# Patient Record
Sex: Female | Born: 1950
Health system: Southern US, Community
[De-identification: ages and names within clinical notes are randomized; demographics above are authoritative.]

## PROBLEM LIST (undated history)

## (undated) DIAGNOSIS — E7800 Pure hypercholesterolemia, unspecified: Secondary | ICD-10-CM

## (undated) DIAGNOSIS — Z923 Personal history of irradiation: Secondary | ICD-10-CM

## (undated) DIAGNOSIS — Z808 Family history of malignant neoplasm of other organs or systems: Secondary | ICD-10-CM

## (undated) HISTORY — DX: Family history of malignant neoplasm of other organs or systems: Z80.8

## (undated) HISTORY — PX: BREAST BIOPSY: SHX20

## (undated) HISTORY — PX: BUNIONECTOMY: SHX129

## (undated) HISTORY — PX: BREAST LUMPECTOMY: SHX2

## (undated) HISTORY — PX: OTHER SURGICAL HISTORY: SHX169

## (undated) HISTORY — PX: MOHS SURGERY: SHX181

## (undated) HISTORY — PX: TUBAL LIGATION: SHX77

## (undated) HISTORY — PX: BREAST EXCISIONAL BIOPSY: SUR124

## (undated) HISTORY — PX: REDUCTION MAMMAPLASTY: SUR839

## (undated) HISTORY — PX: TONSILLECTOMY AND ADENOIDECTOMY: SUR1326

---

## 2005-01-19 ENCOUNTER — Emergency Department (HOSPITAL_COMMUNITY): Admission: EM | Admit: 2005-01-19 | Discharge: 2005-01-19 | Payer: Self-pay | Admitting: Emergency Medicine

## 2005-02-20 ENCOUNTER — Other Ambulatory Visit: Admission: RE | Admit: 2005-02-20 | Discharge: 2005-02-20 | Payer: Self-pay | Admitting: Family Medicine

## 2005-03-20 ENCOUNTER — Encounter: Admission: RE | Admit: 2005-03-20 | Discharge: 2005-03-20 | Payer: Self-pay | Admitting: Family Medicine

## 2005-03-22 ENCOUNTER — Ambulatory Visit: Payer: Self-pay | Admitting: Gastroenterology

## 2005-04-04 ENCOUNTER — Ambulatory Visit: Payer: Self-pay | Admitting: Gastroenterology

## 2006-03-22 ENCOUNTER — Encounter: Admission: RE | Admit: 2006-03-22 | Discharge: 2006-03-22 | Payer: Self-pay | Admitting: Family Medicine

## 2007-03-25 ENCOUNTER — Encounter: Admission: RE | Admit: 2007-03-25 | Discharge: 2007-03-25 | Payer: Self-pay | Admitting: Family Medicine

## 2008-03-25 ENCOUNTER — Encounter: Admission: RE | Admit: 2008-03-25 | Discharge: 2008-03-25 | Payer: Self-pay | Admitting: Family Medicine

## 2009-04-08 ENCOUNTER — Encounter: Admission: RE | Admit: 2009-04-08 | Discharge: 2009-04-08 | Payer: Self-pay | Admitting: Obstetrics

## 2009-12-23 ENCOUNTER — Encounter: Admission: RE | Admit: 2009-12-23 | Discharge: 2009-12-23 | Payer: Self-pay | Admitting: Obstetrics

## 2010-04-10 ENCOUNTER — Encounter: Admission: RE | Admit: 2010-04-10 | Discharge: 2010-04-10 | Payer: Self-pay | Admitting: Obstetrics

## 2011-03-12 ENCOUNTER — Other Ambulatory Visit: Payer: Self-pay | Admitting: Obstetrics

## 2011-03-12 DIAGNOSIS — Z1231 Encounter for screening mammogram for malignant neoplasm of breast: Secondary | ICD-10-CM

## 2011-04-12 ENCOUNTER — Ambulatory Visit
Admission: RE | Admit: 2011-04-12 | Discharge: 2011-04-12 | Disposition: A | Discharge status: Home / Self Care | Payer: BC Managed Care – PPO | Source: Ambulatory Visit | Attending: Obstetrics | Admitting: Obstetrics

## 2011-04-12 DIAGNOSIS — Z1231 Encounter for screening mammogram for malignant neoplasm of breast: Secondary | ICD-10-CM

## 2012-04-24 ENCOUNTER — Other Ambulatory Visit: Payer: Self-pay | Admitting: Obstetrics

## 2012-04-24 DIAGNOSIS — Z1231 Encounter for screening mammogram for malignant neoplasm of breast: Secondary | ICD-10-CM

## 2012-05-29 ENCOUNTER — Ambulatory Visit
Admission: RE | Admit: 2012-05-29 | Discharge: 2012-05-29 | Disposition: A | Discharge status: Home / Self Care | Payer: BC Managed Care – PPO | Source: Ambulatory Visit | Attending: Obstetrics | Admitting: Obstetrics

## 2012-05-29 DIAGNOSIS — Z1231 Encounter for screening mammogram for malignant neoplasm of breast: Secondary | ICD-10-CM

## 2013-04-27 ENCOUNTER — Other Ambulatory Visit: Payer: Self-pay

## 2013-04-27 DIAGNOSIS — Z1231 Encounter for screening mammogram for malignant neoplasm of breast: Secondary | ICD-10-CM

## 2013-06-01 ENCOUNTER — Ambulatory Visit
Admission: RE | Admit: 2013-06-01 | Discharge: 2013-06-01 | Disposition: A | Discharge status: Home / Self Care | Payer: BC Managed Care – PPO | Source: Ambulatory Visit

## 2013-06-01 DIAGNOSIS — Z1231 Encounter for screening mammogram for malignant neoplasm of breast: Secondary | ICD-10-CM

## 2014-04-26 ENCOUNTER — Other Ambulatory Visit: Payer: Self-pay

## 2014-04-26 DIAGNOSIS — Z1231 Encounter for screening mammogram for malignant neoplasm of breast: Secondary | ICD-10-CM

## 2014-06-02 ENCOUNTER — Ambulatory Visit
Admission: RE | Admit: 2014-06-02 | Discharge: 2014-06-02 | Disposition: A | Discharge status: Home / Self Care | Payer: BLUE CROSS/BLUE SHIELD | Source: Ambulatory Visit

## 2014-06-02 DIAGNOSIS — Z1231 Encounter for screening mammogram for malignant neoplasm of breast: Secondary | ICD-10-CM

## 2014-07-21 ENCOUNTER — Other Ambulatory Visit: Payer: Self-pay | Admitting: Family Medicine

## 2014-07-21 ENCOUNTER — Encounter: Payer: Self-pay | Admitting: Gastroenterology

## 2014-07-21 ENCOUNTER — Other Ambulatory Visit (HOSPITAL_COMMUNITY)
Admission: RE | Admit: 2014-07-21 | Discharge: 2014-07-21 | Disposition: A | Discharge status: Home / Self Care | Payer: BLUE CROSS/BLUE SHIELD | Source: Ambulatory Visit | Attending: Family Medicine | Admitting: Family Medicine

## 2014-07-21 DIAGNOSIS — R87810 Cervical high risk human papillomavirus (HPV) DNA test positive: Secondary | ICD-10-CM | POA: Diagnosis present

## 2014-07-21 DIAGNOSIS — Z01419 Encounter for gynecological examination (general) (routine) without abnormal findings: Secondary | ICD-10-CM | POA: Insufficient documentation

## 2014-07-21 DIAGNOSIS — Z1151 Encounter for screening for human papillomavirus (HPV): Secondary | ICD-10-CM | POA: Diagnosis present

## 2014-07-22 LAB — CYTOLOGY - PAP

## 2015-02-24 ENCOUNTER — Encounter: Payer: Self-pay | Admitting: Gastroenterology

## 2015-04-04 ENCOUNTER — Ambulatory Visit (AMBULATORY_SURGERY_CENTER): Payer: Self-pay | Admitting: *Deleted

## 2015-04-04 VITALS — Ht 63.0 in | Wt 138.8 lb

## 2015-04-04 DIAGNOSIS — Z1211 Encounter for screening for malignant neoplasm of colon: Secondary | ICD-10-CM

## 2015-04-04 MED ORDER — SUPREP BOWEL PREP KIT 17.5-3.13-1.6 GM/177ML PO SOLN: 1.0000 | Freq: Once | ORAL | Status: DC

## 2015-04-04 NOTE — Progress Notes (Signed)
Patient denies any allergies to egg or soy products. Patient denies complications with anesthesia/sedation.  Patient denies oxygen use at home and denies diet medications. Emmi instructions for colonoscopy offered but patient denied.

## 2015-04-07 ENCOUNTER — Encounter: Payer: Self-pay | Admitting: Gastroenterology

## 2015-04-15 ENCOUNTER — Ambulatory Visit (AMBULATORY_SURGERY_CENTER): Payer: BLUE CROSS/BLUE SHIELD | Admitting: Gastroenterology

## 2015-04-15 ENCOUNTER — Encounter: Payer: Self-pay | Admitting: Gastroenterology

## 2015-04-15 VITALS — BP 119/74 | HR 79 | Temp 98.1°F | Resp 28 | Ht 63.0 in | Wt 138.0 lb

## 2015-04-15 DIAGNOSIS — Z1211 Encounter for screening for malignant neoplasm of colon: Secondary | ICD-10-CM | POA: Diagnosis not present

## 2015-04-15 DIAGNOSIS — D125 Benign neoplasm of sigmoid colon: Secondary | ICD-10-CM | POA: Diagnosis not present

## 2015-04-15 MED ORDER — SODIUM CHLORIDE 0.9 % IV SOLN: 500.0000 mL | INTRAVENOUS | Status: DC

## 2015-04-15 NOTE — Op Note (Signed)
[place]  [address]. [place], [address]   COLONOSCOPY PROCEDURE REPORT  PATIENT: Janice [name], Janice [name]  MR#: [id number] BIRTHDATE: [birth year] , 64  yrs. old GENDER: female ENDOSCOPIST: Milus Banister, MD PROCEDURE DATE:  04/15/2015 PROCEDURE:   Colonoscopy, screening and Colonoscopy with biopsy First Screening Colonoscopy - Avg.  risk and is 50 yrs.  old or older - No.  Prior Negative Screening - Now for repeat screening. 10 or more years since last screening  History of Adenoma - Now for follow-up colonoscopy & has been > or = to 3 yrs.  N/A  Polyps removed today? Yes ASA CLASS:   Class II INDICATIONS:Screening for colonic neoplasia, Colorectal Neoplasm Risk Assessment for this procedure is average risk, and colnoscopy 2006 Dr.  Sharlett Iles, no polyps. MEDICATIONS: Monitored anesthesia care and Propofol 300 mg IV  DESCRIPTION OF PROCEDURE:   After the risks benefits and alternatives of the procedure were thoroughly explained, informed consent was obtained.  The digital rectal exam revealed no abnormalities of the rectum.   The LB [id number]  endoscope was introduced through the anus and advanced to the cecum, which was identified by both the appendix and ileocecal valve. No adverse events experienced.   The quality of the prep was good.  The instrument was then slowly withdrawn as the colon was fully examined. Estimated blood loss is zero unless otherwise noted in this procedure report.   COLON FINDINGS: A sessile polyp measuring 2 mm in size was found in the sigmoid colon.  A polypectomy was performed with cold forceps. The resection was complete, the polyp tissue was completely retrieved and sent to histology.   There was moderate diverticulosis noted in the left colon with associated muscular hypertrophy and tortuosity.  Retroflexed views revealed no abnormalities. The time to cecum = 9.5 Withdrawal time = 9.1   The scope was withdrawn and the  procedure completed. COMPLICATIONS: There were no immediate complications.  ENDOSCOPIC IMPRESSION: 1.   Sessile polyp was found in the sigmoid colon; polypectomy was performed with cold forceps 2.   There was moderate diverticulosis noted in the left colon  RECOMMENDATIONS: If the polyp(s) removed today are proven to be adenomatous (pre-cancerous) polyps, you will need a repeat colonoscopy in 5 years.  Otherwise you should continue to follow colorectal cancer screening guidelines for "routine risk" patients with colonoscopy in 10 years.  You will receive a letter within 1-2 weeks with the results of your biopsy as well as final recommendations.  Please call my office if you have not received a letter after 3 weeks.  eSigned:  Milus Banister, MD 04/15/2015 2:50 PM

## 2015-04-15 NOTE — Progress Notes (Signed)
Called to room to assist during endoscopic procedure.  Patient ID and intended procedure confirmed with present staff. Received instructions for my participation in the procedure from the performing physician.  

## 2015-04-15 NOTE — Patient Instructions (Signed)
YOU HAD AN ENDOSCOPIC PROCEDURE TODAY AT THE [facility]:   Refer to the procedure report that was given to you for any specific questions about what was found during the examination.  If the procedure report does not answer your questions, please call your gastroenterologist to clarify.  If you requested that your care partner not be given the details of your procedure findings, then the procedure report has been included in a sealed envelope for you to review at your convenience later.  YOU SHOULD EXPECT: Some feelings of bloating in the abdomen. Passage of more gas than usual.  Walking can help get rid of the air that was put into your GI tract during the procedure and reduce the bloating. If you had a lower endoscopy (such as a colonoscopy or flexible sigmoidoscopy) you may notice spotting of blood in your stool or on the toilet paper. If you underwent a bowel prep for your procedure, you may not have a normal bowel movement for a few days.  Please Note:  You might notice some irritation and congestion in your nose or some drainage.  This is from the oxygen used during your procedure.  There is no need for concern and it should clear up in a day or so.  SYMPTOMS TO REPORT IMMEDIATELY:   Following lower endoscopy (colonoscopy or flexible sigmoidoscopy):  Excessive amounts of blood in the stool  Significant tenderness or worsening of abdominal pains  Swelling of the abdomen that is new, acute  Fever of 100F or higher  For urgent or emergent issues, a gastroenterologist can be reached at any hour by calling [phone].  DIET: Your first meal following the procedure should be a small meal and then it is ok to progress to your normal diet. Heavy or fried foods are harder to digest and may make you feel nauseous or bloated.  Likewise, meals heavy in dairy and vegetables can increase bloating.  Drink plenty of fluids but you should avoid alcoholic beverages for 24 hours.  ACTIVITY:   You should plan to take it easy for the rest of today and you should NOT DRIVE or use heavy machinery until tomorrow (because of the sedation medicines used during the test).    FOLLOW UP: Our staff will call the number listed on your records the next business day following your procedure to check on you and address any questions or concerns that you may have regarding the information given to you following your procedure. If we do not reach you, we will leave a message.  However, if you are feeling well and you are not experiencing any problems, there is no need to return our call.  We will assume that you have returned to your regular daily activities without incident.  If any biopsies were taken you will be contacted by phone or by letter within the next 1-3 weeks.  Please call us at [phone] if you have not heard about the biopsies in 3 weeks.    SIGNATURES/CONFIDENTIALITY: You and/or your care partner have signed paperwork which will be entered into your electronic medical record.  These signatures attest to the fact that that the information above on your After Visit Summary has been reviewed and is understood.  Full responsibility of the confidentiality of this discharge information lies with you and/or your care-partner.  Continue your normal medications  Please read over handouts about polyps, diverticulosis and high fiber diets 

## 2015-04-15 NOTE — Progress Notes (Signed)
To recovery, report to [place], RN, VSS 

## 2015-04-18 ENCOUNTER — Telehealth: Payer: Self-pay | Admitting: *Deleted

## 2015-04-18 NOTE — Telephone Encounter (Signed)
  Follow up Call-  Call back number 04/15/2015  Post procedure Call Back phone  # [phone]  Permission to leave phone message Yes     Patient questions:  Do you have a fever, pain , or abdominal swelling? No. Pain Score  0 *  Have you tolerated food without any problems? Yes.    Have you been able to return to your normal activities? Yes.    Do you have any questions about your discharge instructions: Diet   No. Medications  No. Follow up visit  No.  Do you have questions or concerns about your Care? No.  Actions: * If pain score is 4 or above: No action needed, pain <4.

## 2015-04-20 ENCOUNTER — Encounter: Payer: Self-pay | Admitting: Gastroenterology

## 2015-04-25 ENCOUNTER — Other Ambulatory Visit: Payer: Self-pay

## 2015-04-25 DIAGNOSIS — Z1231 Encounter for screening mammogram for malignant neoplasm of breast: Secondary | ICD-10-CM

## 2015-06-06 ENCOUNTER — Ambulatory Visit
Admission: RE | Admit: 2015-06-06 | Discharge: 2015-06-06 | Disposition: A | Discharge status: Home / Self Care | Payer: BLUE CROSS/BLUE SHIELD | Source: Ambulatory Visit

## 2015-06-06 DIAGNOSIS — Z1231 Encounter for screening mammogram for malignant neoplasm of breast: Secondary | ICD-10-CM

## 2015-07-27 ENCOUNTER — Other Ambulatory Visit (HOSPITAL_COMMUNITY)
Admission: RE | Admit: 2015-07-27 | Discharge: 2015-07-27 | Disposition: A | Discharge status: Home / Self Care | Payer: BLUE CROSS/BLUE SHIELD | Source: Ambulatory Visit | Attending: Family Medicine | Admitting: Family Medicine

## 2015-07-27 DIAGNOSIS — Z1151 Encounter for screening for human papillomavirus (HPV): Secondary | ICD-10-CM | POA: Insufficient documentation

## 2015-07-27 DIAGNOSIS — Z01411 Encounter for gynecological examination (general) (routine) with abnormal findings: Secondary | ICD-10-CM | POA: Diagnosis present

## 2015-08-15 ENCOUNTER — Other Ambulatory Visit: Payer: Self-pay | Admitting: Family Medicine

## 2016-05-02 ENCOUNTER — Other Ambulatory Visit: Payer: Self-pay | Admitting: Family Medicine

## 2016-05-02 DIAGNOSIS — Z1231 Encounter for screening mammogram for malignant neoplasm of breast: Secondary | ICD-10-CM

## 2016-06-08 ENCOUNTER — Ambulatory Visit
Admission: RE | Admit: 2016-06-08 | Discharge: 2016-06-08 | Disposition: A | Discharge status: Home / Self Care | Payer: PPO | Source: Ambulatory Visit | Attending: Family Medicine | Admitting: Family Medicine

## 2016-06-08 DIAGNOSIS — Z1231 Encounter for screening mammogram for malignant neoplasm of breast: Secondary | ICD-10-CM

## 2016-09-07 ENCOUNTER — Other Ambulatory Visit: Payer: Self-pay | Admitting: Physician Assistant

## 2016-09-07 ENCOUNTER — Other Ambulatory Visit (HOSPITAL_COMMUNITY)
Admission: RE | Admit: 2016-09-07 | Discharge: 2016-09-07 | Disposition: A | Discharge status: Home / Self Care | Payer: PPO | Source: Ambulatory Visit | Attending: Physician Assistant | Admitting: Physician Assistant

## 2016-09-07 DIAGNOSIS — Z1159 Encounter for screening for other viral diseases: Secondary | ICD-10-CM | POA: Diagnosis not present

## 2016-09-07 DIAGNOSIS — Z124 Encounter for screening for malignant neoplasm of cervix: Secondary | ICD-10-CM | POA: Insufficient documentation

## 2016-09-07 DIAGNOSIS — M8580 Other specified disorders of bone density and structure, unspecified site: Secondary | ICD-10-CM | POA: Diagnosis not present

## 2016-09-07 DIAGNOSIS — Z0000 Encounter for general adult medical examination without abnormal findings: Secondary | ICD-10-CM | POA: Diagnosis not present

## 2016-09-07 DIAGNOSIS — Z131 Encounter for screening for diabetes mellitus: Secondary | ICD-10-CM | POA: Diagnosis not present

## 2016-09-07 DIAGNOSIS — R87610 Atypical squamous cells of undetermined significance on cytologic smear of cervix (ASC-US): Secondary | ICD-10-CM | POA: Diagnosis not present

## 2016-09-12 LAB — CYTOLOGY - PAP
Diagnosis: UNDETERMINED — AB
HPV: DETECTED — AB

## 2016-10-05 ENCOUNTER — Other Ambulatory Visit: Payer: Self-pay | Admitting: Family Medicine

## 2016-10-05 DIAGNOSIS — N879 Dysplasia of cervix uteri, unspecified: Secondary | ICD-10-CM | POA: Diagnosis not present

## 2016-10-05 DIAGNOSIS — R87610 Atypical squamous cells of undetermined significance on cytologic smear of cervix (ASC-US): Secondary | ICD-10-CM | POA: Diagnosis not present

## 2016-12-05 DIAGNOSIS — H5213 Myopia, bilateral: Secondary | ICD-10-CM | POA: Diagnosis not present

## 2016-12-17 ENCOUNTER — Other Ambulatory Visit: Payer: Self-pay | Admitting: Obstetrics and Gynecology

## 2016-12-17 ENCOUNTER — Other Ambulatory Visit (HOSPITAL_COMMUNITY)
Admission: RE | Admit: 2016-12-17 | Discharge: 2016-12-17 | Disposition: A | Discharge status: Home / Self Care | Payer: PPO | Source: Ambulatory Visit | Attending: Obstetrics and Gynecology | Admitting: Obstetrics and Gynecology

## 2016-12-17 DIAGNOSIS — R87810 Cervical high risk human papillomavirus (HPV) DNA test positive: Secondary | ICD-10-CM | POA: Diagnosis not present

## 2016-12-17 DIAGNOSIS — K6289 Other specified diseases of anus and rectum: Secondary | ICD-10-CM | POA: Diagnosis not present

## 2016-12-17 DIAGNOSIS — Z1151 Encounter for screening for human papillomavirus (HPV): Secondary | ICD-10-CM | POA: Diagnosis not present

## 2016-12-17 DIAGNOSIS — R87610 Atypical squamous cells of undetermined significance on cytologic smear of cervix (ASC-US): Secondary | ICD-10-CM | POA: Diagnosis not present

## 2016-12-17 DIAGNOSIS — N9089 Other specified noninflammatory disorders of vulva and perineum: Secondary | ICD-10-CM | POA: Diagnosis not present

## 2016-12-17 DIAGNOSIS — M8588 Other specified disorders of bone density and structure, other site: Secondary | ICD-10-CM | POA: Diagnosis not present

## 2016-12-17 DIAGNOSIS — A630 Anogenital (venereal) warts: Secondary | ICD-10-CM | POA: Diagnosis not present

## 2016-12-19 LAB — CERVICOVAGINAL ANCILLARY ONLY: HPV: NOT DETECTED

## 2017-01-17 DIAGNOSIS — A630 Anogenital (venereal) warts: Secondary | ICD-10-CM | POA: Diagnosis not present

## 2017-01-25 DIAGNOSIS — A630 Anogenital (venereal) warts: Secondary | ICD-10-CM | POA: Diagnosis not present

## 2017-01-30 DIAGNOSIS — A630 Anogenital (venereal) warts: Secondary | ICD-10-CM | POA: Diagnosis not present

## 2017-05-06 ENCOUNTER — Other Ambulatory Visit: Payer: Self-pay | Admitting: Family Medicine

## 2017-05-06 DIAGNOSIS — Z139 Encounter for screening, unspecified: Secondary | ICD-10-CM

## 2017-06-10 ENCOUNTER — Ambulatory Visit
Admission: RE | Admit: 2017-06-10 | Discharge: 2017-06-10 | Disposition: A | Discharge status: Home / Self Care | Payer: PPO | Source: Ambulatory Visit | Attending: Family Medicine | Admitting: Family Medicine

## 2017-06-10 DIAGNOSIS — Z1231 Encounter for screening mammogram for malignant neoplasm of breast: Secondary | ICD-10-CM | POA: Diagnosis not present

## 2017-06-10 DIAGNOSIS — Z139 Encounter for screening, unspecified: Secondary | ICD-10-CM

## 2017-09-06 ENCOUNTER — Other Ambulatory Visit: Payer: Self-pay | Admitting: Obstetrics and Gynecology

## 2017-09-06 ENCOUNTER — Other Ambulatory Visit (HOSPITAL_COMMUNITY)
Admission: RE | Admit: 2017-09-06 | Discharge: 2017-09-06 | Disposition: A | Discharge status: Home / Self Care | Payer: PPO | Source: Ambulatory Visit | Attending: Obstetrics and Gynecology | Admitting: Obstetrics and Gynecology

## 2017-09-06 DIAGNOSIS — R87810 Cervical high risk human papillomavirus (HPV) DNA test positive: Secondary | ICD-10-CM | POA: Diagnosis not present

## 2017-09-06 DIAGNOSIS — A630 Anogenital (venereal) warts: Secondary | ICD-10-CM | POA: Diagnosis not present

## 2017-09-06 DIAGNOSIS — Z01411 Encounter for gynecological examination (general) (routine) with abnormal findings: Secondary | ICD-10-CM | POA: Diagnosis not present

## 2017-09-10 LAB — CYTOLOGY - PAP
Diagnosis: NEGATIVE
HPV: NOT DETECTED

## 2017-09-20 DIAGNOSIS — Z0000 Encounter for general adult medical examination without abnormal findings: Secondary | ICD-10-CM | POA: Diagnosis not present

## 2017-09-20 DIAGNOSIS — A630 Anogenital (venereal) warts: Secondary | ICD-10-CM | POA: Diagnosis not present

## 2017-09-20 DIAGNOSIS — E559 Vitamin D deficiency, unspecified: Secondary | ICD-10-CM | POA: Diagnosis not present

## 2017-09-20 DIAGNOSIS — Z6823 Body mass index (BMI) 23.0-23.9, adult: Secondary | ICD-10-CM | POA: Diagnosis not present

## 2017-09-20 DIAGNOSIS — Z131 Encounter for screening for diabetes mellitus: Secondary | ICD-10-CM | POA: Diagnosis not present

## 2017-09-20 DIAGNOSIS — M8580 Other specified disorders of bone density and structure, unspecified site: Secondary | ICD-10-CM | POA: Diagnosis not present

## 2017-09-20 DIAGNOSIS — R87610 Atypical squamous cells of undetermined significance on cytologic smear of cervix (ASC-US): Secondary | ICD-10-CM | POA: Diagnosis not present

## 2017-09-20 DIAGNOSIS — Z23 Encounter for immunization: Secondary | ICD-10-CM | POA: Diagnosis not present

## 2018-04-22 ENCOUNTER — Other Ambulatory Visit (HOSPITAL_COMMUNITY)
Admission: RE | Admit: 2018-04-22 | Discharge: 2018-04-22 | Disposition: A | Discharge status: Home / Self Care | Payer: PPO | Source: Ambulatory Visit | Attending: Obstetrics and Gynecology | Admitting: Obstetrics and Gynecology

## 2018-04-22 ENCOUNTER — Other Ambulatory Visit: Payer: Self-pay | Admitting: Obstetrics and Gynecology

## 2018-04-22 DIAGNOSIS — Z124 Encounter for screening for malignant neoplasm of cervix: Secondary | ICD-10-CM | POA: Diagnosis not present

## 2018-04-22 DIAGNOSIS — Z01411 Encounter for gynecological examination (general) (routine) with abnormal findings: Secondary | ICD-10-CM | POA: Insufficient documentation

## 2018-04-25 LAB — CYTOLOGY - PAP
Diagnosis: NEGATIVE
HPV: NOT DETECTED

## 2018-05-14 ENCOUNTER — Other Ambulatory Visit: Payer: Self-pay | Admitting: Family Medicine

## 2018-05-14 DIAGNOSIS — Z1231 Encounter for screening mammogram for malignant neoplasm of breast: Secondary | ICD-10-CM

## 2018-06-12 ENCOUNTER — Ambulatory Visit
Admission: RE | Admit: 2018-06-12 | Discharge: 2018-06-12 | Disposition: A | Discharge status: Home / Self Care | Payer: PPO | Source: Ambulatory Visit | Attending: Family Medicine | Admitting: Family Medicine

## 2018-06-12 DIAGNOSIS — Z1231 Encounter for screening mammogram for malignant neoplasm of breast: Secondary | ICD-10-CM | POA: Diagnosis not present

## 2018-09-26 DIAGNOSIS — M8580 Other specified disorders of bone density and structure, unspecified site: Secondary | ICD-10-CM | POA: Diagnosis not present

## 2018-09-26 DIAGNOSIS — Z0000 Encounter for general adult medical examination without abnormal findings: Secondary | ICD-10-CM | POA: Diagnosis not present

## 2019-05-11 ENCOUNTER — Other Ambulatory Visit: Payer: Self-pay | Admitting: Family Medicine

## 2019-05-11 DIAGNOSIS — Z1231 Encounter for screening mammogram for malignant neoplasm of breast: Secondary | ICD-10-CM

## 2019-05-29 ENCOUNTER — Ambulatory Visit: Payer: PPO | Attending: Internal Medicine

## 2019-05-29 DIAGNOSIS — Z23 Encounter for immunization: Secondary | ICD-10-CM | POA: Insufficient documentation

## 2019-05-29 NOTE — Progress Notes (Signed)
   Covid-19 Vaccination Clinic  Name:  Janice [name]    MRN: [id number] DOB: 05/25/1950  05/29/2019  Janice [name] was observed post Covid-19 immunization for 15 minutes without incidence. She was provided with Vaccine Information Sheet and instruction to access the V-Safe system.   Janice [name] was instructed to call 911 with any severe reactions post vaccine: Marland Kitchen Difficulty breathing  . Swelling of your face and throat  . A fast heartbeat  . A bad rash all over your body  . Dizziness and weakness    Immunizations Administered    Name Date Dose VIS Date Route   Pfizer COVID-19 Vaccine 05/29/2019  2:47 PM 0.3 mL 04/17/2019 Intramuscular   Manufacturer: [place]   Lot: [id number]   [place]: [id number]

## 2019-06-16 ENCOUNTER — Ambulatory Visit
Admission: RE | Admit: 2019-06-16 | Discharge: 2019-06-16 | Disposition: A | Discharge status: Home / Self Care | Payer: PPO | Source: Ambulatory Visit | Attending: Family Medicine | Admitting: Family Medicine

## 2019-06-16 ENCOUNTER — Other Ambulatory Visit: Payer: Self-pay

## 2019-06-16 DIAGNOSIS — Z1231 Encounter for screening mammogram for malignant neoplasm of breast: Secondary | ICD-10-CM

## 2019-06-19 ENCOUNTER — Ambulatory Visit: Payer: PPO | Attending: Internal Medicine

## 2019-06-19 DIAGNOSIS — Z23 Encounter for immunization: Secondary | ICD-10-CM | POA: Insufficient documentation

## 2019-06-19 NOTE — Progress Notes (Signed)
   Covid-19 Vaccination Clinic  Name:  Janice [name]    MRN: [id number] DOB: 07/29/1950  06/19/2019  Janice [name] was observed post Covid-19 immunization for 15 minutes without incidence. She was provided with Vaccine Information Sheet and instruction to access the V-Safe system.   Janice [name] was instructed to call 911 with any severe reactions post vaccine: Marland Kitchen Difficulty breathing  . Swelling of your face and throat  . A fast heartbeat  . A bad rash all over your body  . Dizziness and weakness    Immunizations Administered    Name Date Dose VIS Date Route   Pfizer COVID-19 Vaccine 06/19/2019  2:10 PM 0.3 mL 04/17/2019 Intramuscular   Manufacturer: [place]   Lot: [id number]   [place]: [id number]

## 2019-08-21 ENCOUNTER — Other Ambulatory Visit: Payer: Self-pay | Admitting: Obstetrics and Gynecology

## 2019-08-21 DIAGNOSIS — Z113 Encounter for screening for infections with a predominantly sexual mode of transmission: Secondary | ICD-10-CM | POA: Diagnosis not present

## 2019-08-21 DIAGNOSIS — Z131 Encounter for screening for diabetes mellitus: Secondary | ICD-10-CM | POA: Diagnosis not present

## 2019-08-21 DIAGNOSIS — Z136 Encounter for screening for cardiovascular disorders: Secondary | ICD-10-CM | POA: Diagnosis not present

## 2019-08-21 DIAGNOSIS — Z1329 Encounter for screening for other suspected endocrine disorder: Secondary | ICD-10-CM | POA: Diagnosis not present

## 2019-08-21 DIAGNOSIS — M8580 Other specified disorders of bone density and structure, unspecified site: Secondary | ICD-10-CM

## 2019-08-21 DIAGNOSIS — Z9189 Other specified personal risk factors, not elsewhere classified: Secondary | ICD-10-CM | POA: Diagnosis not present

## 2019-08-21 DIAGNOSIS — Z13220 Encounter for screening for lipoid disorders: Secondary | ICD-10-CM | POA: Diagnosis not present

## 2019-10-07 ENCOUNTER — Ambulatory Visit
Admission: RE | Admit: 2019-10-07 | Discharge: 2019-10-07 | Disposition: A | Discharge status: Home / Self Care | Payer: PPO | Source: Ambulatory Visit | Attending: Obstetrics and Gynecology | Admitting: Obstetrics and Gynecology

## 2019-10-07 ENCOUNTER — Other Ambulatory Visit: Payer: Self-pay

## 2019-10-07 DIAGNOSIS — Z780 Asymptomatic menopausal state: Secondary | ICD-10-CM | POA: Diagnosis not present

## 2019-10-07 DIAGNOSIS — M8580 Other specified disorders of bone density and structure, unspecified site: Secondary | ICD-10-CM

## 2019-10-07 DIAGNOSIS — M8589 Other specified disorders of bone density and structure, multiple sites: Secondary | ICD-10-CM | POA: Diagnosis not present

## 2020-05-02 ENCOUNTER — Other Ambulatory Visit: Payer: Self-pay | Admitting: Family Medicine

## 2020-05-02 DIAGNOSIS — Z1231 Encounter for screening mammogram for malignant neoplasm of breast: Secondary | ICD-10-CM

## 2020-06-07 DIAGNOSIS — C801 Malignant (primary) neoplasm, unspecified: Secondary | ICD-10-CM

## 2020-06-07 HISTORY — DX: Malignant (primary) neoplasm, unspecified: C80.1

## 2020-06-16 ENCOUNTER — Ambulatory Visit
Admission: RE | Admit: 2020-06-16 | Discharge: 2020-06-16 | Disposition: A | Discharge status: Home / Self Care | Payer: PPO | Source: Ambulatory Visit | Attending: Family Medicine | Admitting: Family Medicine

## 2020-06-16 ENCOUNTER — Other Ambulatory Visit: Payer: Self-pay

## 2020-06-16 DIAGNOSIS — Z1231 Encounter for screening mammogram for malignant neoplasm of breast: Secondary | ICD-10-CM

## 2020-06-20 DIAGNOSIS — Z6822 Body mass index (BMI) 22.0-22.9, adult: Secondary | ICD-10-CM | POA: Diagnosis not present

## 2020-06-20 DIAGNOSIS — Z0000 Encounter for general adult medical examination without abnormal findings: Secondary | ICD-10-CM | POA: Diagnosis not present

## 2020-06-20 DIAGNOSIS — M8580 Other specified disorders of bone density and structure, unspecified site: Secondary | ICD-10-CM | POA: Diagnosis not present

## 2020-06-20 DIAGNOSIS — E7800 Pure hypercholesterolemia, unspecified: Secondary | ICD-10-CM | POA: Diagnosis not present

## 2020-06-20 DIAGNOSIS — E559 Vitamin D deficiency, unspecified: Secondary | ICD-10-CM | POA: Diagnosis not present

## 2020-06-20 DIAGNOSIS — Z131 Encounter for screening for diabetes mellitus: Secondary | ICD-10-CM | POA: Diagnosis not present

## 2020-06-21 ENCOUNTER — Other Ambulatory Visit: Payer: Self-pay | Admitting: Family Medicine

## 2020-06-21 DIAGNOSIS — R928 Other abnormal and inconclusive findings on diagnostic imaging of breast: Secondary | ICD-10-CM

## 2020-07-04 ENCOUNTER — Other Ambulatory Visit: Payer: Self-pay

## 2020-07-04 ENCOUNTER — Ambulatory Visit
Admission: RE | Admit: 2020-07-04 | Discharge: 2020-07-04 | Disposition: A | Discharge status: Home / Self Care | Payer: PPO | Source: Ambulatory Visit | Attending: Family Medicine | Admitting: Family Medicine

## 2020-07-04 ENCOUNTER — Other Ambulatory Visit: Payer: Self-pay | Admitting: Family Medicine

## 2020-07-04 DIAGNOSIS — R928 Other abnormal and inconclusive findings on diagnostic imaging of breast: Secondary | ICD-10-CM

## 2020-07-04 DIAGNOSIS — R921 Mammographic calcification found on diagnostic imaging of breast: Secondary | ICD-10-CM

## 2020-07-05 ENCOUNTER — Other Ambulatory Visit (HOSPITAL_COMMUNITY): Payer: Self-pay | Admitting: Family Medicine

## 2020-07-05 ENCOUNTER — Other Ambulatory Visit: Payer: Self-pay | Admitting: Family Medicine

## 2020-07-05 DIAGNOSIS — E7800 Pure hypercholesterolemia, unspecified: Secondary | ICD-10-CM

## 2020-07-07 ENCOUNTER — Ambulatory Visit
Admission: RE | Admit: 2020-07-07 | Discharge: 2020-07-07 | Disposition: A | Discharge status: Home / Self Care | Payer: PPO | Source: Ambulatory Visit | Attending: Family Medicine | Admitting: Family Medicine

## 2020-07-07 ENCOUNTER — Other Ambulatory Visit: Payer: Self-pay

## 2020-07-07 DIAGNOSIS — R921 Mammographic calcification found on diagnostic imaging of breast: Secondary | ICD-10-CM

## 2020-07-07 DIAGNOSIS — D0512 Intraductal carcinoma in situ of left breast: Secondary | ICD-10-CM | POA: Diagnosis not present

## 2020-07-12 ENCOUNTER — Other Ambulatory Visit: Payer: Self-pay

## 2020-07-12 ENCOUNTER — Ambulatory Visit (HOSPITAL_COMMUNITY)
Admission: RE | Admit: 2020-07-12 | Discharge: 2020-07-12 | Disposition: A | Discharge status: Home / Self Care | Payer: PPO | Source: Ambulatory Visit | Attending: Family Medicine | Admitting: Family Medicine

## 2020-07-12 DIAGNOSIS — E7800 Pure hypercholesterolemia, unspecified: Secondary | ICD-10-CM | POA: Insufficient documentation

## 2020-07-12 DIAGNOSIS — I700 Atherosclerosis of aorta: Secondary | ICD-10-CM

## 2020-07-13 ENCOUNTER — Encounter: Payer: Self-pay | Admitting: Adult Health

## 2020-07-13 DIAGNOSIS — Z170 Estrogen receptor positive status [ER+]: Secondary | ICD-10-CM | POA: Insufficient documentation

## 2020-07-13 DIAGNOSIS — D0512 Intraductal carcinoma in situ of left breast: Secondary | ICD-10-CM | POA: Insufficient documentation

## 2020-07-13 DIAGNOSIS — C50412 Malignant neoplasm of upper-outer quadrant of left female breast: Secondary | ICD-10-CM | POA: Insufficient documentation

## 2020-07-15 ENCOUNTER — Other Ambulatory Visit: Payer: Self-pay | Admitting: General Surgery

## 2020-07-15 DIAGNOSIS — Z170 Estrogen receptor positive status [ER+]: Secondary | ICD-10-CM

## 2020-07-15 DIAGNOSIS — C50412 Malignant neoplasm of upper-outer quadrant of left female breast: Secondary | ICD-10-CM

## 2020-07-20 ENCOUNTER — Other Ambulatory Visit: Payer: Self-pay

## 2020-07-20 ENCOUNTER — Encounter (HOSPITAL_BASED_OUTPATIENT_CLINIC_OR_DEPARTMENT_OTHER): Payer: Self-pay | Admitting: General Surgery

## 2020-07-21 ENCOUNTER — Other Ambulatory Visit: Payer: Self-pay | Admitting: General Surgery

## 2020-07-21 ENCOUNTER — Telehealth: Payer: Self-pay | Admitting: Hematology and Oncology

## 2020-07-21 DIAGNOSIS — Z170 Estrogen receptor positive status [ER+]: Secondary | ICD-10-CM

## 2020-07-21 DIAGNOSIS — C50412 Malignant neoplasm of upper-outer quadrant of left female breast: Secondary | ICD-10-CM

## 2020-07-21 NOTE — Telephone Encounter (Signed)
Received a new pt referral from dr. Barry Dienes for a new dx of breast cancer. Pt has been cld and scheduled to see Dr. Lindi Adie on 4/5 at 1045am. Pt wanted an early appt. Aware to arrive 20 minutes early.

## 2020-07-22 ENCOUNTER — Other Ambulatory Visit (HOSPITAL_COMMUNITY)
Admission: RE | Admit: 2020-07-22 | Discharge: 2020-07-22 | Disposition: A | Discharge status: Home / Self Care | Payer: PPO | Source: Ambulatory Visit | Attending: General Surgery | Admitting: General Surgery

## 2020-07-22 DIAGNOSIS — Z20822 Contact with and (suspected) exposure to covid-19: Secondary | ICD-10-CM | POA: Insufficient documentation

## 2020-07-22 DIAGNOSIS — Z01812 Encounter for preprocedural laboratory examination: Secondary | ICD-10-CM | POA: Insufficient documentation

## 2020-07-22 LAB — SARS CORONAVIRUS 2 (TAT 6-24 HRS): SARS Coronavirus 2: NEGATIVE

## 2020-07-22 NOTE — Progress Notes (Signed)

## 2020-07-25 ENCOUNTER — Ambulatory Visit
Admission: RE | Admit: 2020-07-25 | Discharge: 2020-07-25 | Disposition: A | Discharge status: Home / Self Care | Payer: PPO | Source: Ambulatory Visit | Attending: General Surgery | Admitting: General Surgery

## 2020-07-25 ENCOUNTER — Other Ambulatory Visit: Payer: Self-pay

## 2020-07-25 DIAGNOSIS — Z170 Estrogen receptor positive status [ER+]: Secondary | ICD-10-CM

## 2020-07-25 DIAGNOSIS — C50412 Malignant neoplasm of upper-outer quadrant of left female breast: Secondary | ICD-10-CM

## 2020-07-25 DIAGNOSIS — D0512 Intraductal carcinoma in situ of left breast: Secondary | ICD-10-CM | POA: Diagnosis not present

## 2020-07-26 ENCOUNTER — Ambulatory Visit (HOSPITAL_BASED_OUTPATIENT_CLINIC_OR_DEPARTMENT_OTHER)
Admission: RE | Admit: 2020-07-26 | Discharge: 2020-07-26 | Disposition: A | Discharge status: Home / Self Care | Payer: PPO | Attending: General Surgery | Admitting: General Surgery

## 2020-07-26 ENCOUNTER — Ambulatory Visit
Admission: RE | Admit: 2020-07-26 | Discharge: 2020-07-26 | Disposition: A | Discharge status: Home / Self Care | Payer: PPO | Source: Ambulatory Visit | Attending: General Surgery | Admitting: General Surgery

## 2020-07-26 ENCOUNTER — Encounter (HOSPITAL_BASED_OUTPATIENT_CLINIC_OR_DEPARTMENT_OTHER): Payer: Self-pay | Admitting: General Surgery

## 2020-07-26 ENCOUNTER — Other Ambulatory Visit: Payer: Self-pay

## 2020-07-26 ENCOUNTER — Ambulatory Visit (HOSPITAL_BASED_OUTPATIENT_CLINIC_OR_DEPARTMENT_OTHER): Payer: PPO | Admitting: Anesthesiology

## 2020-07-26 ENCOUNTER — Encounter (HOSPITAL_BASED_OUTPATIENT_CLINIC_OR_DEPARTMENT_OTHER)
Admission: RE | Disposition: A | Discharge status: Home / Self Care | Payer: Self-pay | Source: Home / Self Care | Attending: General Surgery

## 2020-07-26 DIAGNOSIS — C50412 Malignant neoplasm of upper-outer quadrant of left female breast: Secondary | ICD-10-CM | POA: Insufficient documentation

## 2020-07-26 DIAGNOSIS — Z170 Estrogen receptor positive status [ER+]: Secondary | ICD-10-CM

## 2020-07-26 DIAGNOSIS — D0512 Intraductal carcinoma in situ of left breast: Secondary | ICD-10-CM | POA: Diagnosis not present

## 2020-07-26 DIAGNOSIS — D242 Benign neoplasm of left breast: Secondary | ICD-10-CM | POA: Diagnosis not present

## 2020-07-26 DIAGNOSIS — N6489 Other specified disorders of breast: Secondary | ICD-10-CM | POA: Diagnosis not present

## 2020-07-26 HISTORY — PX: BREAST LUMPECTOMY WITH RADIOACTIVE SEED LOCALIZATION: SHX6424

## 2020-07-26 SURGERY — BREAST LUMPECTOMY WITH RADIOACTIVE SEED LOCALIZATION
Anesthesia: General | Site: Breast | Laterality: Left

## 2020-07-26 MED ORDER — DROPERIDOL 2.5 MG/ML IJ SOLN
INTRAMUSCULAR | Status: DC | PRN
  Administered 2020-07-26: .625 mg via INTRAVENOUS

## 2020-07-26 MED ORDER — CEFAZOLIN SODIUM-DEXTROSE 2-4 GM/100ML-% IV SOLN
INTRAVENOUS | Status: AC
  Filled 2020-07-26: qty 100

## 2020-07-26 MED ORDER — LACTATED RINGERS IV SOLN: INTRAVENOUS | Status: DC

## 2020-07-26 MED ORDER — OXYCODONE HCL 5 MG/5ML PO SOLN: 5.0000 mg | Freq: Once | ORAL | Status: DC | PRN

## 2020-07-26 MED ORDER — MIDAZOLAM HCL 5 MG/5ML IJ SOLN
INTRAMUSCULAR | Status: DC | PRN
  Administered 2020-07-26: 1 mg via INTRAVENOUS

## 2020-07-26 MED ORDER — ONDANSETRON HCL 4 MG/2ML IJ SOLN
INTRAMUSCULAR | Status: DC | PRN
  Administered 2020-07-26: 4 mg via INTRAVENOUS

## 2020-07-26 MED ORDER — ACETAMINOPHEN 500 MG PO TABS
1000.0000 mg | ORAL_TABLET | ORAL | Status: AC
  Administered 2020-07-26: 1000 mg via ORAL

## 2020-07-26 MED ORDER — CEFAZOLIN SODIUM-DEXTROSE 2-4 GM/100ML-% IV SOLN
2.0000 g | INTRAVENOUS | Status: AC
  Administered 2020-07-26: 2 g via INTRAVENOUS

## 2020-07-26 MED ORDER — AMISULPRIDE (ANTIEMETIC) 5 MG/2ML IV SOLN: 10.0000 mg | Freq: Once | INTRAVENOUS | Status: DC | PRN

## 2020-07-26 MED ORDER — DEXAMETHASONE SODIUM PHOSPHATE 4 MG/ML IJ SOLN
INTRAMUSCULAR | Status: DC | PRN
  Administered 2020-07-26: 10 mg via INTRAVENOUS

## 2020-07-26 MED ORDER — EPHEDRINE 5 MG/ML INJ
INTRAVENOUS | Status: AC
  Filled 2020-07-26: qty 10

## 2020-07-26 MED ORDER — LIDOCAINE 2% (20 MG/ML) 5 ML SYRINGE
INTRAMUSCULAR | Status: AC
  Filled 2020-07-26: qty 5

## 2020-07-26 MED ORDER — ONDANSETRON HCL 4 MG/2ML IJ SOLN
INTRAMUSCULAR | Status: AC
  Filled 2020-07-26: qty 2

## 2020-07-26 MED ORDER — DEXAMETHASONE SODIUM PHOSPHATE 10 MG/ML IJ SOLN
INTRAMUSCULAR | Status: AC
  Filled 2020-07-26: qty 1

## 2020-07-26 MED ORDER — CHLORHEXIDINE GLUCONATE CLOTH 2 % EX PADS: 6.0000 | MEDICATED_PAD | Freq: Once | CUTANEOUS | Status: DC

## 2020-07-26 MED ORDER — PROPOFOL 10 MG/ML IV BOLUS
INTRAVENOUS | Status: DC | PRN
  Administered 2020-07-26: 200 mg via INTRAVENOUS

## 2020-07-26 MED ORDER — FENTANYL CITRATE (PF) 100 MCG/2ML IJ SOLN
INTRAMUSCULAR | Status: AC
  Filled 2020-07-26: qty 2

## 2020-07-26 MED ORDER — OXYCODONE HCL 5 MG PO TABS: 5.0000 mg | ORAL_TABLET | Freq: Once | ORAL | Status: DC | PRN

## 2020-07-26 MED ORDER — LIDOCAINE HCL (CARDIAC) PF 100 MG/5ML IV SOSY
PREFILLED_SYRINGE | INTRAVENOUS | Status: DC | PRN
  Administered 2020-07-26: 60 mg via INTRAVENOUS

## 2020-07-26 MED ORDER — HYDROMORPHONE HCL 1 MG/ML IJ SOLN
0.2500 mg | INTRAMUSCULAR | Status: DC | PRN
Start: 2020-07-26 — End: 2020-07-26

## 2020-07-26 MED ORDER — OXYCODONE HCL 5 MG PO TABS: 5.0000 mg | ORAL_TABLET | Freq: Four times a day (QID) | ORAL | 0 refills | Status: DC | PRN

## 2020-07-26 MED ORDER — LIDOCAINE HCL 1 % IJ SOLN
INTRAMUSCULAR | Status: DC | PRN
  Administered 2020-07-26: 40 mL

## 2020-07-26 MED ORDER — FENTANYL CITRATE (PF) 100 MCG/2ML IJ SOLN
INTRAMUSCULAR | Status: DC | PRN
  Administered 2020-07-26: 50 ug via INTRAVENOUS

## 2020-07-26 MED ORDER — PROMETHAZINE HCL 25 MG/ML IJ SOLN: 6.2500 mg | INTRAMUSCULAR | Status: DC | PRN

## 2020-07-26 MED ORDER — MEPERIDINE HCL 25 MG/ML IJ SOLN: 6.2500 mg | INTRAMUSCULAR | Status: DC | PRN

## 2020-07-26 MED ORDER — PHENYLEPHRINE 40 MCG/ML (10ML) SYRINGE FOR IV PUSH (FOR BLOOD PRESSURE SUPPORT)
PREFILLED_SYRINGE | INTRAVENOUS | Status: AC
  Filled 2020-07-26: qty 10

## 2020-07-26 MED ORDER — SUCCINYLCHOLINE CHLORIDE 200 MG/10ML IV SOSY
PREFILLED_SYRINGE | INTRAVENOUS | Status: AC
  Filled 2020-07-26: qty 10

## 2020-07-26 MED ORDER — BUPIVACAINE-EPINEPHRINE (PF) 0.25% -1:200000 IJ SOLN
INTRAMUSCULAR | Status: AC
  Filled 2020-07-26: qty 30

## 2020-07-26 MED ORDER — MIDAZOLAM HCL 2 MG/2ML IJ SOLN
INTRAMUSCULAR | Status: AC
  Filled 2020-07-26: qty 2

## 2020-07-26 MED ORDER — EPHEDRINE SULFATE 50 MG/ML IJ SOLN
INTRAMUSCULAR | Status: DC | PRN
  Administered 2020-07-26: 15 mg via INTRAVENOUS
  Administered 2020-07-26: 10 mg via INTRAVENOUS

## 2020-07-26 MED ORDER — ACETAMINOPHEN 500 MG PO TABS
ORAL_TABLET | ORAL | Status: AC
  Filled 2020-07-26: qty 2

## 2020-07-26 SURGICAL SUPPLY — 57 items
ADH SKN CLS APL DERMABOND .7 (GAUZE/BANDAGES/DRESSINGS) ×1
APL PRP STRL LF DISP 70% ISPRP (MISCELLANEOUS) ×1
BINDER BREAST LRG (GAUZE/BANDAGES/DRESSINGS) IMPLANT
BINDER BREAST MEDIUM (GAUZE/BANDAGES/DRESSINGS) ×1 IMPLANT
BINDER BREAST XLRG (GAUZE/BANDAGES/DRESSINGS) IMPLANT
BINDER BREAST XXLRG (GAUZE/BANDAGES/DRESSINGS) IMPLANT
BLADE SURG 10 STRL SS (BLADE) ×2 IMPLANT
BLADE SURG 15 STRL LF DISP TIS (BLADE) IMPLANT
BLADE SURG 15 STRL SS (BLADE)
CANISTER SUC SOCK COL 7IN (MISCELLANEOUS) IMPLANT
CANISTER SUCT 1200ML W/VALVE (MISCELLANEOUS) ×1 IMPLANT
CHLORAPREP W/TINT 26 (MISCELLANEOUS) ×2 IMPLANT
CLIP VESOCCLUDE LG 6/CT (CLIP) ×2 IMPLANT
CLIP VESOCCLUDE MED 6/CT (CLIP) IMPLANT
COVER BACK TABLE 60X90IN (DRAPES) ×2 IMPLANT
COVER MAYO STAND STRL (DRAPES) ×2 IMPLANT
COVER PROBE W GEL 5X96 (DRAPES) ×2 IMPLANT
COVER WAND RF STERILE (DRAPES) IMPLANT
DECANTER SPIKE VIAL GLASS SM (MISCELLANEOUS) IMPLANT
DERMABOND ADVANCED (GAUZE/BANDAGES/DRESSINGS) ×1
DERMABOND ADVANCED .7 DNX12 (GAUZE/BANDAGES/DRESSINGS) ×1 IMPLANT
DRAPE LAPAROSCOPIC ABDOMINAL (DRAPES) ×2 IMPLANT
DRAPE UTILITY XL STRL (DRAPES) ×2 IMPLANT
DRSG PAD ABDOMINAL 8X10 ST (GAUZE/BANDAGES/DRESSINGS) ×2 IMPLANT
ELECT COATED BLADE 2.86 ST (ELECTRODE) ×2 IMPLANT
ELECT REM PT RETURN 9FT ADLT (ELECTROSURGICAL) ×2
ELECTRODE REM PT RTRN 9FT ADLT (ELECTROSURGICAL) ×1 IMPLANT
GAUZE SPONGE 4X4 12PLY STRL LF (GAUZE/BANDAGES/DRESSINGS) ×2 IMPLANT
GLOVE SURG ENC MOIS LTX SZ6 (GLOVE) ×2 IMPLANT
GLOVE SURG UNDER POLY LF SZ6.5 (GLOVE) ×2 IMPLANT
GLOVE SURG UNDER POLY LF SZ7 (GLOVE) ×1 IMPLANT
GOWN STRL REUS W/ TWL LRG LVL3 (GOWN DISPOSABLE) IMPLANT
GOWN STRL REUS W/ TWL XL LVL3 (GOWN DISPOSABLE) IMPLANT
GOWN STRL REUS W/TWL 2XL LVL3 (GOWN DISPOSABLE) ×2 IMPLANT
GOWN STRL REUS W/TWL LRG LVL3 (GOWN DISPOSABLE)
GOWN STRL REUS W/TWL XL LVL3 (GOWN DISPOSABLE) ×2
KIT MARKER MARGIN INK (KITS) ×2 IMPLANT
LIGHT WAVEGUIDE WIDE FLAT (MISCELLANEOUS) IMPLANT
NDL HYPO 25X1 1.5 SAFETY (NEEDLE) ×1 IMPLANT
NEEDLE HYPO 25X1 1.5 SAFETY (NEEDLE) ×2 IMPLANT
NS IRRIG 1000ML POUR BTL (IV SOLUTION) ×2 IMPLANT
PACK BASIN DAY SURGERY FS (CUSTOM PROCEDURE TRAY) ×2 IMPLANT
PENCIL SMOKE EVACUATOR (MISCELLANEOUS) ×2 IMPLANT
SLEEVE SCD COMPRESS KNEE MED (STOCKING) ×2 IMPLANT
SPONGE LAP 18X18 RF (DISPOSABLE) ×2 IMPLANT
STRIP CLOSURE SKIN 1/2X4 (GAUZE/BANDAGES/DRESSINGS) ×2 IMPLANT
SUT MNCRL AB 4-0 PS2 18 (SUTURE) ×2 IMPLANT
SUT SILK 2 0 SH (SUTURE) IMPLANT
SUT VIC AB 2-0 SH 27 (SUTURE) ×2
SUT VIC AB 2-0 SH 27XBRD (SUTURE) ×1 IMPLANT
SUT VIC AB 3-0 SH 27 (SUTURE) ×2
SUT VIC AB 3-0 SH 27X BRD (SUTURE) ×1 IMPLANT
SYR CONTROL 10ML LL (SYRINGE) ×2 IMPLANT
TOWEL GREEN STERILE FF (TOWEL DISPOSABLE) ×2 IMPLANT
TRAY FAXITRON CT DISP (TRAY / TRAY PROCEDURE) ×2 IMPLANT
TUBE CONNECTING 20X1/4 (TUBING) ×2 IMPLANT
YANKAUER SUCT BULB TIP NO VENT (SUCTIONS) ×2 IMPLANT

## 2020-07-26 NOTE — Transfer of Care (Signed)
Immediate Anesthesia Transfer of Care Note  Patient: Janice [name]  Procedure(s) Performed: LEFT BREAST LUMPECTOMY WITH BRACKETED RADIOACTIVE SEED LOCALIZATION (Left Breast)  Patient Location: PACU  Anesthesia Type:General  Level of Consciousness: awake, alert  and oriented  Airway & Oxygen Therapy: Patient Spontanous Breathing and Patient connected to face mask oxygen  Post-op Assessment: Report given to RN and Post -op Vital signs reviewed and stable  Post vital signs: Reviewed and stable  Last Vitals:  Vitals Value Taken Time  BP 114/69 07/26/20 1221  Temp    Pulse    Resp 20 07/26/20 1223  SpO2    Vitals shown include unvalidated device data.  Last Pain:  Vitals:   07/26/20 1009  TempSrc: Oral  PainSc: 0-No pain      Patients Stated Pain Goal: 1 (78/46/96 2952)  Complications: No complications documented.

## 2020-07-26 NOTE — Discharge Instructions (Addendum)
[place] Office Phone Number [phone]  BREAST BIOPSY/ PARTIAL MASTECTOMY: POST OP INSTRUCTIONS  Always review your discharge instruction sheet given to you by the facility where your surgery was performed.  IF YOU HAVE DISABILITY OR FAMILY LEAVE FORMS, YOU MUST BRING THEM TO THE OFFICE FOR PROCESSING.  DO NOT GIVE THEM TO YOUR DOCTOR.  1. A prescription for pain medication may be given to you upon discharge.  Take your pain medication as prescribed, if needed.  If narcotic pain medicine is not needed, then you may take acetaminophen (Tylenol) or ibuprofen (Advil) as needed. 2. Take your usually prescribed medications unless otherwise directed 3. If you need a refill on your pain medication, please contact your pharmacy.  They will contact our office to request authorization.  Prescriptions will not be filled after 5pm or on week-ends. 4. You should eat very light the first 24 hours after surgery, such as soup, crackers, pudding, etc.  Resume your normal diet the day after surgery. 5. Most patients will experience some swelling and bruising in the breast.  Ice packs and a good support bra will help.  Swelling and bruising can take several days to resolve.  6. It is common to experience some constipation if taking pain medication after surgery.  Increasing fluid intake and taking a stool softener will usually help or prevent this problem from occurring.  A mild laxative (Milk of Magnesia or Miralax) should be taken according to package directions if there are no bowel movements after 48 hours. 7. Unless discharge instructions indicate otherwise, you may remove your bandages 48 hours after surgery, and you may shower at that time.  You may have steri-strips (small skin tapes) in place directly over the incision.  These strips should be left on the skin for 7-10 days.   Any sutures or staples will be removed at the office during your follow-up visit. 8. ACTIVITIES:  You may resume  regular daily activities (gradually increasing) beginning the next day.  Wearing a good support bra or sports bra (or the breast binder) minimizes pain and swelling.  You may have sexual intercourse when it is comfortable. a. You may drive when you no longer are taking prescription pain medication, you can comfortably wear a seatbelt, and you can safely maneuver your car and apply brakes. b. RETURN TO WORK:  __________1 week_______________ 9. You should see your doctor in the office for a follow-up appointment approximately two weeks after your surgery.  Your doctor's nurse will typically make your follow-up appointment when she calls you with your pathology report.  Expect your pathology report 2-3 business days after your surgery.  You may call to check if you do not hear from [place] after three days.   WHEN TO CALL YOUR DOCTOR: 1. Fever over 101.0 2. Nausea and/or vomiting. 3. Extreme swelling or bruising. 4. Continued bleeding from incision. 5. Increased pain, redness, or drainage from the incision.  The clinic staff is available to answer your questions during regular business hours.  Please don't hesitate to call and ask to speak to one of the nurses for clinical concerns.  If you have a medical emergency, go to the nearest emergency room or call 911.  A surgeon from [facility] is always on call at the hospital.  For further questions, please visit [email or web address]   May take Tylenol after 4pm, if needed.    Post Anesthesia Home Care Instructions  Activity: Get plenty of rest for the remainder of the day.  A responsible individual must stay with you for 24 hours following the procedure.  For the next 24 hours, DO NOT: -Drive a car -Paediatric nurse -Drink alcoholic beverages -Take any medication unless instructed by your physician -Make any legal decisions or sign important papers.  Meals: Start with liquid foods such as gelatin or soup. Progress to regular  foods as tolerated. Avoid greasy, spicy, heavy foods. If nausea and/or vomiting occur, drink only clear liquids until the nausea and/or vomiting subsides. Call your physician if vomiting continues.  Special Instructions/Symptoms: Your throat may feel dry or sore from the anesthesia or the breathing tube placed in your throat during surgery. If this causes discomfort, gargle with warm salt water. The discomfort should disappear within 24 hours.  If you had a scopolamine patch placed behind your ear for the management of post- operative nausea and/or vomiting:  1. The medication in the patch is effective for 72 hours, after which it should be removed.  Wrap patch in a tissue and discard in the trash. Wash hands thoroughly with soap and water. 2. You may remove the patch earlier than 72 hours if you experience unpleasant side effects which may include dry mouth, dizziness or visual disturbances. 3. Avoid touching the patch. Wash your hands with soap and water after contact with the patch.

## 2020-07-26 NOTE — Op Note (Signed)
Left Breast Radioactive seed bracketed lumpectomy  Indications: This patient presents with history of left breast cancer, cTis, quadrant, upper outer quadrant,  Receptors, cTis  Pre-operative Diagnosis: left breast cancer  Post-operative Diagnosis: Same  Surgeon: Stark Klein   Anesthesia: General endotracheal anesthesia  ASA Class: 2  Procedure Details  The patient was seen in the Holding Room. The risks, benefits, complications, treatment options, and expected outcomes were discussed with the patient. The possibilities of bleeding, infection, the need for additional procedures, failure to diagnose a condition, and creating a complication requiring other procedures or operations were discussed with the patient. The patient concurred with the proposed plan, giving informed consent.  The site of surgery properly noted/marked. The patient was taken to Operating Room # 5, identified, and the procedure verified as left breast seed bracketed lumpectomy.  The left breast and chest were prepped and draped in standard fashion. A lateral incision was made near the previously placed radioactive seed.  Dissection was carried down around the point of maximum signal intensity. The cautery was used to perform the dissection.   The specimen was inked with the margin marker paint kit.    Specimen radiography confirmed inclusion of the mammographic lesion, the clip, and the seed.  The background signal in the breast was zero.  Hemostasis was achieved with cautery.  The cavity was marked with clips on each border other than the anterior border.  The wound was irrigated and closed with 3-0 vicryl interrupted deep dermal sutures and 4-0 monocryl running subcuticular suture.      Sterile dressings were applied. At the end of the operation, all sponge, instrument, and needle counts were correct.   Findings: Seed, clip in specimen.  anterior margin is skin, posterior margin is pectoralis   Estimated Blood Loss:   min         Specimens: left breast tissue with seed         Complications:  None; patient tolerated the procedure well.         Disposition: PACU - hemodynamically stable.         Condition: stable

## 2020-07-26 NOTE — H&P (Signed)
Janice [name] Appointment: 07/15/2020 9:45 AM Location: [facility] Patient #: [id number] DOB: [birth year] Divorced / Language: Janice [name] / Race: White Female   History of Present Illness Janice Klein MD; 07/15/2020 10:42 AM) The patient is a 70 year old female who presents with breast cancer. Pt is a 70 yo F referred by Dr. Melanee Spry for a new diagnosis of left breast cancer 07/2020. She presented with screening detected left breast calcifications. Diagnostic imaging confirmed this and showed 2.3 cm of heterogeneous calcifications. Core needle biopsy was performed and this showed high grade DCIS wtih calcifications and necrosis, ER/PR strongly positive.   Her only family cancer history is "probably occupational" in her grandfather who was a Financial planner and had cancer is his lungs, bone, and brain. She had menarche at age 57, and menopause in her early 17s. She is a Advertising copywriter.   She has a degree in Cabin crew, a masters in Astronomer, and now a Clinical research associate. She has lived all over the world.    dx mammogram 07/04/20 ACR Breast Density Category c: The breast tissue is heterogeneously dense, which may obscure small masses.  FINDINGS: Spot compression magnification views were performed over the upper-outer posterior left breast demonstrating heterogeneous calcifications spanning approximately 2.3 cm. Serpiginous vascular calcifications are noted along the superolateral margin of these more suspicious appearing calcifications.  IMPRESSION: Suspicious 2.3 cm group of calcifications in the upper-outer posterior left breast.  RECOMMENDATION: Recommend stereotactic guided biopsy of the calcifications in the left breast.  I have discussed the findings and recommendations with the patient. If applicable, a reminder letter will be sent to the patient regarding the next appointment.  BI-RADS CATEGORY 4: Suspicious.   pathology core needle bx 07/07/2020 Breast,  left, needle core biopsy, left upper outer - DUCTAL CARCINOMA IN SITU WITH CALCIFICATIONS AND NECROSIS, SEE COMMENT. Microscopic Comment The carcinoma appears high grade. Estrogen Receptor: 100%, POSITIVE, STRONG STAINING INTENSITY Progesterone Receptor: 70%, POSITIVE, MODERATE STAINING INTENSITY   Past Surgical History Janeann Forehand, CNA; 07/15/2020 9:36 AM) Breast Biopsy  Bilateral. Colon Polyp Removal - Colonoscopy  Foot Surgery  Right. Mammoplasty; Reduction  Bilateral. Oral Surgery  Tonsillectomy   Diagnostic Studies History Janeann Forehand, CNA; 07/15/2020 9:36 AM) Colonoscopy  5-10 years ago Mammogram  within last year Pap Smear  1-5 years ago  Allergies Janeann Forehand, CNA; 07/15/2020 9:36 AM) No Known Drug Allergies  [07/15/2020]: Allergies Reconciled   Medication History Janeann Forehand, CNA; 07/15/2020 9:42 AM) B Complex (Oral) Active. Biotin (5MG Capsule, Oral) Active. Vitamin D (Cholecalciferol) (10 MCG(400 UNIT) Tablet Chewable, Oral) Active. Calcium-Vitamin D (150MG Tablet, Oral) Active. Coenzyme Compositum (Injection) Active. Evening Primrose Oil (500MG Capsule, Oral) Active. Glucosamine MSM Complex (Oral) Active. Magnesium Oxide (250MG Tablet, Oral) Active. Multi-Vitamin (Oral) Active. Estroven Menopause & Weight (40-56-300MG Capsule, Oral) Active. Probiotic (Oral) Active. Spirulina (500MG Capsule, Oral) Active. Suprep Bowel Prep Kit (17.5-3.13-1.6GM/177ML Solution, Oral) Active. QC Tumeric Complex (500MG Capsule, Oral) Active. Medications Reconciled  Social History Janeann Forehand, CNA; 07/15/2020 9:36 AM) Alcohol use  Occasional alcohol use. Caffeine use  Coffee, Tea. No drug use  Tobacco use  Never smoker.  Family History Janeann Forehand, CNA; 07/15/2020 9:36 AM) Arthritis  Family Members In General, Mother. Cancer  Family Members In General. Depression  Father. Heart Disease  Mother. Hypertension  Brother,  Family Members In [place], Father, Mother. Ischemic Bowel Disease  Brother, Father. Melanoma  Mother. Migraine Headache  Daughter.  Pregnancy / Birth History Janeann Forehand, CNA; 07/15/2020 9:36 AM) Age at menarche  12 years. Age of menopause  61-55 Gravida  2 Length (months) of breastfeeding  7-12 Maternal age  54-30 Para  2  Other Problems Janeann Forehand, CNA; 07/15/2020 9:36 AM) Depression  Diverticulosis  Hypercholesterolemia  Lump In Breast     Review of Systems Janeann Forehand CNA; 07/15/2020 9:36 AM) General Not Present- Appetite Loss, Chills, Fatigue, Fever, Night Sweats, Weight Gain and Weight Loss. Skin Not Present- Change in Wart/Mole, Dryness, Hives, Jaundice, New Lesions, Non-Healing Wounds, Rash and Ulcer. HEENT Present- Wears glasses/contact lenses. Not Present- Earache, Hearing Loss, Hoarseness, Nose Bleed, Oral Ulcers, Ringing in the Ears, Seasonal Allergies, Sinus Pain, Sore Throat, Visual Disturbances and Yellow Eyes. Respiratory Not Present- Bloody sputum, Chronic Cough, Difficulty Breathing, Snoring and Wheezing. Breast Present- Breast Mass. Not Present- Breast Pain, Nipple Discharge and Skin Changes. Cardiovascular Not Present- Chest Pain, Difficulty Breathing Lying Down, Leg Cramps, Palpitations, Rapid Heart Rate, Shortness of Breath and Swelling of Extremities. Gastrointestinal Not Present- Abdominal Pain, Bloating, Bloody Stool, Change in Bowel Habits, Chronic diarrhea, Constipation, Difficulty Swallowing, Excessive gas, Gets full quickly at meals, Hemorrhoids, Indigestion, Nausea, Rectal Pain and Vomiting. Female Genitourinary Not Present- Frequency, Nocturia, Painful Urination, Pelvic Pain and Urgency. Musculoskeletal Not Present- Back Pain, Joint Pain, Joint Stiffness, Muscle Pain, Muscle Weakness and Swelling of Extremities. Neurological Not Present- Decreased Memory, Fainting, Headaches, Numbness, Seizures, Tingling, Tremor, Trouble walking  and Weakness. Psychiatric Not Present- Anxiety, Bipolar, Change in Sleep Pattern, Depression, Fearful and Frequent crying. Endocrine Not Present- Cold Intolerance, Excessive Hunger, Hair Changes, Heat Intolerance, Hot flashes and New Diabetes. Hematology Not Present- Blood Thinners, Easy Bruising, Excessive bleeding, Gland problems, HIV and Persistent Infections.  Vitals Adriana Reams Alston CNA; 07/15/2020 9:43 AM) 07/15/2020 9:42 AM Weight: 135 lb Height: 63in Body Surface Area: 1.64 m Body Mass Index: 23.91 kg/m  Temp.: 98.79F  Pulse: 89 (Regular)  P.OX: 99% (Room air) BP: 140/76(Sitting, Left Arm, Standard)       Physical Exam Janice Klein MD; 07/15/2020 10:43 AM) General Mental Status-Alert. General Appearance-Consistent with stated age. Hydration-Well hydrated. Voice-Normal.  Head and Neck Head-normocephalic, atraumatic with no lesions or palpable masses. Trachea-midline. Thyroid Gland Characteristics - normal size and consistency.  Eye Eyeball - Bilateral-Extraocular movements intact. Sclera/Conjunctiva - Bilateral-No scleral icterus.  Chest and Lung Exam Chest and lung exam reveals -quiet, even and easy respiratory effort with no use of accessory muscles and on auscultation, normal breath sounds, no adventitious sounds and normal vocal resonance. Inspection Chest Wall - Normal. Back - normal.  Breast Note: breasts symmetric. reduction mammoplasty incisions evident. No palpable masses. no skin dimpling. Heterogeneously dense breasts. No LAD. no nipple retraction. some bruising in the UOQ of the left breast.   Cardiovascular Cardiovascular examination reveals -normal heart sounds, regular rate and rhythm with no murmurs and normal pedal pulses bilaterally.  Abdomen Inspection Inspection of the abdomen reveals - No Hernias. Palpation/Percussion Palpation and Percussion of the abdomen reveal - Soft, Non Tender, No Rebound tenderness,  No Rigidity (guarding) and No hepatosplenomegaly. Auscultation Auscultation of the abdomen reveals - Bowel sounds normal.  Neurologic Neurologic evaluation reveals -alert and oriented x 3 with no impairment of recent or remote memory. Mental Status-Normal.  Musculoskeletal Global Assessment -Note: no gross deformities.  Normal Exam - Left-Upper Extremity Strength Normal and Lower Extremity Strength Normal. Normal Exam - Right-Upper Extremity Strength Normal and Lower Extremity Strength Normal.  Lymphatic Head & Neck  General Head & Neck Lymphatics: Bilateral - Description - Normal. Axillary  General Axillary Region: Bilateral - Description -  Normal. Tenderness - Non Tender. Femoral & Inguinal  Generalized Femoral & Inguinal Lymphatics: Bilateral - Description - No Generalized lymphadenopathy.    Assessment & Plan Janice Klein MD; 07/15/2020 10:44 AM) MALIGNANT NEOPLASM OF UPPER-OUTER QUADRANT OF LEFT BREAST IN FEMALE, ESTROGEN RECEPTOR POSITIVE (C50.412) Impression: Pt has a new diagnosis of a Stage 0 left breast cancer, cTis, upper outer quadrant, hormone positive.  I will plan a seed bracketed lumpectomy for the patient wtih referrals to rad onc and med onc for adjuvant treatment.  The surgical procedure was described to the patient. I discussed the incision type and location and that we would need radiology involved on with a wire or seed marker and/or sentinel node.  The risks and benefits of the procedure were described to the patient and she wishes to proceed.  We discussed the risks bleeding, infection, damage to other structures, need for further procedures/surgeries. We discussed the risk of seroma. The patient was advised if the area in the breast in cancer, we may need to go back to surgery for additional tissue to obtain negative margins or for a lymph node biopsy. The patient was advised that these are the most common complications, but that others can  occur as well. They were advised against taking aspirin or other anti-inflammatory agents/blood thinners the week before surgery. Current Plans Pt Education - [facility] breast cancer surgery: discussed with patient and provided information. Referred to Oncology, for evaluation and follow up (Oncology). Routine. Referred to Radiation Oncology, for evaluation and follow up (Radiation Oncology). Routine. You are being scheduled for surgery- Our schedulers will call you.  You should hear from our office's scheduling department within 5 working days about the location, date, and time of surgery. We try to make accommodations for patient's preferences in scheduling surgery, but sometimes the OR schedule or the surgeon's schedule prevents [place] from making those accommodations.  If you have not heard from our office [phone]) in 5 working days, call the office and ask for your surgeon's nurse.  If you have other questions about your diagnosis, plan, or surgery, call the office and ask for your surgeon's nurse.    Signed electronically by Janice Klein, MD (07/15/2020 10:44 AM)

## 2020-07-26 NOTE — Anesthesia Preprocedure Evaluation (Signed)
Anesthesia Evaluation  Patient identified by MRN, date of birth, ID band Patient awake    Reviewed: Allergy & Precautions, NPO status , Patient's Chart, lab work & pertinent test results  Airway Mallampati: II  TM Distance: >3 FB Neck ROM: Full    Dental no notable dental hx.    Pulmonary neg pulmonary ROS,    Pulmonary exam normal breath sounds clear to auscultation       Cardiovascular negative cardio ROS Normal cardiovascular exam Rhythm:Regular Rate:Normal     Neuro/Psych negative neurological ROS  negative psych ROS   GI/Hepatic negative GI ROS, Neg liver ROS,   Endo/Other  negative endocrine ROS  Renal/GU negative Renal ROS  negative genitourinary   Musculoskeletal negative musculoskeletal ROS (+)   Abdominal   Peds negative pediatric ROS (+)  Hematology negative hematology ROS (+)   Anesthesia Other Findings Breast Cancer  Reproductive/Obstetrics negative OB ROS                             Anesthesia Physical Anesthesia Plan  ASA: II  Anesthesia Plan: General   Post-op Pain Management:    Induction: Intravenous  PONV Risk Score and Plan: 3 and Ondansetron, Dexamethasone, Midazolam and Treatment may vary due to age or medical condition  Airway Management Planned: LMA  Additional Equipment:   Intra-op Plan:   Post-operative Plan: Extubation in OR  Informed Consent: I have reviewed the patients History and Physical, chart, labs and discussed the procedure including the risks, benefits and alternatives for the proposed anesthesia with the patient or authorized representative who has indicated his/her understanding and acceptance.     Dental advisory given  Plan Discussed with: CRNA  Anesthesia Plan Comments:         Anesthesia Quick Evaluation

## 2020-07-26 NOTE — Anesthesia Postprocedure Evaluation (Signed)
Anesthesia Post Note  Patient: Janice [name]  Procedure(s) Performed: LEFT BREAST LUMPECTOMY WITH BRACKETED RADIOACTIVE SEED LOCALIZATION (Left Breast)     Patient location during evaluation: PACU Anesthesia Type: General Level of consciousness: awake and alert Pain management: pain level controlled Vital Signs Assessment: post-procedure vital signs reviewed and stable Respiratory status: spontaneous breathing, nonlabored ventilation and respiratory function stable Cardiovascular status: blood pressure returned to baseline and stable Postop Assessment: no apparent nausea or vomiting Anesthetic complications: no   No complications documented.  Last Vitals:  Vitals:   07/26/20 1238 07/26/20 1303  BP: 118/70 124/72  Pulse: 86 87  Resp: 19 20  Temp:  (!) 36.3 C  SpO2: 100% 100%    Last Pain:  Vitals:   07/26/20 1303  TempSrc: Oral  PainSc: 0-No pain                 Lynda Rainwater

## 2020-07-26 NOTE — Anesthesia Procedure Notes (Signed)
Procedure Name: Intubation Date/Time: 07/26/2020 11:14 AM Performed by: Willa Frater, CRNA Pre-anesthesia Checklist: Patient identified, Emergency Drugs available, Suction available and Patient being monitored Patient Re-evaluated:Patient Re-evaluated prior to induction Oxygen Delivery Method: Circle system utilized Preoxygenation: Pre-oxygenation with 100% oxygen Induction Type: IV induction Ventilation: Mask ventilation without difficulty LMA: LMA inserted LMA Size: 4.0 Grade View: Grade I Tube type: Oral Number of attempts: 1 Airway Equipment and Method: Stylet and Oral airway Placement Confirmation: ETT inserted through vocal cords under direct vision,  positive ETCO2 and breath sounds checked- equal and bilateral Tube secured with: Tape Dental Injury: Teeth and Oropharynx as per pre-operative assessment

## 2020-07-26 NOTE — Interval H&P Note (Signed)
History and Physical Interval Note:  07/26/2020 10:47 AM  Janice [name]  has presented today for surgery, with the diagnosis of LEFT BREAST CANCER.  The various methods of treatment have been discussed with the patient and family. After consideration of risks, benefits and other options for treatment, the patient has consented to  Procedure(s) with comments: LEFT BREAST LUMPECTOMY WITH BRACKETED RADIOACTIVE SEED LOCALIZATION (Left) - RNFA; START TIME OF 11:45 AM FOR 60 MINUTES ROOM 5 Jonuel Butterfield IQ as a surgical intervention.  The patient's history has been reviewed, patient examined, no change in status, stable for surgery.  I have reviewed the patient's chart and labs.  Questions were answered to the patient's satisfaction.     Stark Klein

## 2020-07-27 ENCOUNTER — Encounter (HOSPITAL_BASED_OUTPATIENT_CLINIC_OR_DEPARTMENT_OTHER): Payer: Self-pay | Admitting: General Surgery

## 2020-07-27 NOTE — Progress Notes (Signed)
New Breast Cancer Diagnosis: Left Breast- UOQ  Did patient present with symptoms (if so, please note symptoms) or screening mammography?:Screening Calcifications    Location and Extent of disease :left breast. Located at Upper Outer posterior Left breast, measured  2.3 cm of heterogeneous calcifications.   Histology per Pathology Report: grade High, DCIS with calcifications and necrosis.  Left Breast Biopsy 07/07/2020  Receptor Status: ER(positive), PR (positive), Her2-neu (), Ki-(%)  Surgeon and surgical plan, if any: Dr. Barry Dienes -Left Breast Lumpectomy with Bracketed radioactive seed localization 07/26/2020   Medical oncologist, treatment if any:   Dr. Lindi Adie 08/09/2020 1045  Family History of Breast/Ovarian/Prostate Cancer: None  Lymphedema issues, if any:  No  Pain issues, if any:  Tenderness at surgical site.  SAFETY ISSUES: Prior radiation? No Pacemaker/ICD? No Possible current pregnancy? Postmenopausal Is the patient on methotrexate? No  Current Complaints / other details:

## 2020-07-28 ENCOUNTER — Encounter: Payer: Self-pay | Admitting: Licensed Clinical Social Worker

## 2020-07-28 ENCOUNTER — Ambulatory Visit
Admission: RE | Admit: 2020-07-28 | Discharge: 2020-07-28 | Disposition: A | Discharge status: Home / Self Care | Payer: PPO | Source: Ambulatory Visit | Attending: Radiation Oncology | Admitting: Radiation Oncology

## 2020-07-28 ENCOUNTER — Other Ambulatory Visit: Payer: Self-pay

## 2020-07-28 ENCOUNTER — Encounter: Payer: Self-pay | Admitting: Radiation Oncology

## 2020-07-28 VITALS — BP 157/93 | HR 80 | Temp 96.9°F | Resp 18 | Ht 63.0 in | Wt 132.4 lb

## 2020-07-28 DIAGNOSIS — C50412 Malignant neoplasm of upper-outer quadrant of left female breast: Secondary | ICD-10-CM

## 2020-07-28 DIAGNOSIS — Z79899 Other long term (current) drug therapy: Secondary | ICD-10-CM | POA: Insufficient documentation

## 2020-07-28 DIAGNOSIS — I700 Atherosclerosis of aorta: Secondary | ICD-10-CM | POA: Diagnosis not present

## 2020-07-28 DIAGNOSIS — Z170 Estrogen receptor positive status [ER+]: Secondary | ICD-10-CM | POA: Insufficient documentation

## 2020-07-28 DIAGNOSIS — D0512 Intraductal carcinoma in situ of left breast: Secondary | ICD-10-CM | POA: Diagnosis not present

## 2020-07-28 HISTORY — DX: Pure hypercholesterolemia, unspecified: E78.00

## 2020-07-28 LAB — SURGICAL PATHOLOGY

## 2020-07-28 NOTE — Progress Notes (Signed)
[place] Psychosocial Distress Screening Clinical Social Work  Clinical Social Work was referred by distress screening protocol.  The patient scored a 8 on the Psychosocial Distress Thermometer which indicates severe distress. Clinical Social Worker contacted patient by phone to assess for distress and other psychosocial needs.   Patient reports doing well overall. It was a surprise but she is viewing it as a "bump in the road". Pt relayed that compared to pain of childbirth and stressor of divorce, this is very manageable for her. She has had moments where she has paused to take deep breaths and process (such as seeing the surgical sight for the first time), but otherwise is using her faith, family, friends, and many interests to maintain a positive outlook. She is a Biomedical scientist and enjoys to [employer], walks regularly, listens to podcasts, does needlework, and plays the piano.   CSW provided information on support services as well as direct contact information for patient to utilize as needed.   ONCBCN DISTRESS SCREENING 07/28/2020  Screening Type Initial Screening  Distress experienced in past week (1-10) 8  Physical Problem type Pain;Sleep/insomnia  Other Contact via [phone]    Clinical Social Worker follow up needed: No.  If yes, follow up plan:  Bradyn Soward E Soloman Mckeithan, LCSW

## 2020-07-28 NOTE — Progress Notes (Signed)
Radiation Oncology         [phone]) [phone] ________________________________  Name: Janice [name]        MRN: [id number]  Date of Service: 07/28/2020 DOB: 1951-04-23  VO:[id number], Lattie Haw, MD  Stark Klein, MD     REFERRING PHYSICIAN: Stark Klein, MD   DIAGNOSIS: There were no encounter diagnoses.   HISTORY OF PRESENT ILLNESS: Janice [name] is a 70 y.o. female seen at the request of Dr. Barry Dienes for a newly diagnosed left breast cancer.  The patient was found to have screening detected calcifications on mammography in the upper outer posterior left breast that measured up to 2.3 cm.  A stereotactic biopsy on 07/07/2020 revealed DCIS with calcifications and necrosis the tumor was ER/PR positive. She subsequently underwent a left breast lumpectomy on 07/26/2020.  Final pathology is not available at the time of dictation.  She is seen today to discuss options of adjuvant radiotherapy at the appropriate time.  She is also scheduled to meet with Dr. Lindi Adie on 08/09/2020.   PREVIOUS RADIATION THERAPY: No   PAST MEDICAL HISTORY:  Past Medical History:  Diagnosis Date  . Cancer ([place]) 06/2020   left breast cancer       PAST SURGICAL HISTORY: Past Surgical History:  Procedure Laterality Date  . BREAST BIOPSY Left    Pt unsure of date  over 15 years ago- [facility]   . BREAST EXCISIONAL BIOPSY Right    in her 20s  . breast lift Bilateral   . BREAST LUMPECTOMY Right    benign  . BREAST LUMPECTOMY WITH RADIOACTIVE SEED LOCALIZATION Left 07/26/2020   Procedure: LEFT BREAST LUMPECTOMY WITH BRACKETED RADIOACTIVE SEED LOCALIZATION;  Surgeon: Stark Klein, MD;  Location: [facility];  Service: General;  Laterality: Left;  RNFA; START TIME OF 11:45 AM FOR 60 MINUTES ROOM 5 BYERLY IQ  . BUNIONECTOMY Right   . lower body lift    . MOHS SURGERY     bridge of nose  . REDUCTION MAMMAPLASTY Bilateral   . TONSILLECTOMY AND ADENOIDECTOMY     age 48  . TUBAL LIGATION        FAMILY HISTORY:  Family History  Problem Relation Age of Onset  . Colon cancer Neg Hx   . Colon polyps Neg Hx   . Esophageal cancer Neg Hx   . Rectal cancer Neg Hx   . Stomach cancer Neg Hx      SOCIAL HISTORY:  reports that she has never smoked. She has never used smokeless tobacco. She reports current alcohol use of about 2.0 standard drinks of alcohol per week. She reports that she does not use drugs.  Patient is divorced and resides in [place].  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 She enjoys cooking, reading, exercising and spending times with her dogs.     ALLERGIES: Patient has no known allergies.   MEDICATIONS:  Current Outpatient Medications  Medication Sig Dispense Refill  . b complex vitamins tablet Take 1 tablet by mouth daily.    . calcium-vitamin D (OSCAL WITH D) 500-200 MG-UNIT tablet Take 1 tablet by mouth.    . Coenzyme Q10 300 MG CAPS Take by mouth.    . Evening Primrose Oil 500 MG CAPS Take 1 capsule by mouth daily.    Donnie Aho (GLUCOSAMINE MSM COMPLEX PO) Take 1,500 mg by mouth daily.    . magnesium oxide (MAG-OX) 400 MG tablet Take 400  mg by mouth daily.    . Multiple Vitamin (MULTIVITAMIN) tablet Take 1 tablet by mouth daily.    . Nutritional Supplements (ESTROVEN PO) Take by mouth daily.    Marland Kitchen OVER THE COUNTER MEDICATION Take 450 mg by mouth daily. Tumeric    . oxyCODONE (OXY IR/ROXICODONE) 5 MG immediate release tablet Take 1 tablet (5 mg total) by mouth every 6 (six) hours as needed for severe pain. 5 tablet 0  . Probiotic Product (PROBIOTIC DAILY PO) Take by mouth daily.    Marland Kitchen Spirulina 500 MG TABS Take 500 mg by mouth daily.    . Turmeric 500 MG CAPS Take 1,000 mg by mouth daily.    Marland Kitchen zinc gluconate 50 MG tablet Take 50 mg by mouth daily.     No current facility-administered medications for this encounter.     REVIEW OF SYSTEMS: On review of systems, the patient  reports that she is doing well overall. She is having some soreness since her surgery and a bit of trouble sleeping but no other concerns of other breast complaints.    PHYSICAL EXAM:  Wt Readings from Last 3 Encounters:  07/26/20 129 lb 13.6 oz (58.9 kg)  04/15/15 138 lb (62.6 kg)  04/04/15 138 lb 12.8 oz (63 kg)   Temp Readings from Last 3 Encounters:  07/26/20 (!) 97.4 F (36.3 C) (Oral)  04/15/15 98.1 F (36.7 C)   BP Readings from Last 3 Encounters:  07/26/20 124/72  04/15/15 119/74   Pulse Readings from Last 3 Encounters:  07/26/20 87  04/15/15 79    In general this is a well appearing Caucasian female in no acute distress. She's alert and oriented x4 and appropriate throughout the examination. Cardiopulmonary assessment is negative for acute distress and she exhibits normal effort. Breast exam is deferred.   ECOG = 1  0 - Asymptomatic (Fully active, able to carry on all predisease activities without restriction)  1 - Symptomatic but completely ambulatory (Restricted in physically strenuous activity but ambulatory and able to carry out work of a light or sedentary nature. For example, light housework, office work)  2 - Symptomatic, <50% in bed during the day (Ambulatory and capable of all self care but unable to carry out any work activities. Up and about more than 50% of waking hours)  3 - Symptomatic, >50% in bed, but not bedbound (Capable of only limited self-care, confined to bed or chair 50% or more of waking hours)  4 - Bedbound (Completely disabled. Cannot carry on any self-care. Totally confined to bed or chair)  5 - Death   Janice [name], Creech RH, Tormey DC, et al. [phone]). "Toxicity and response criteria of the [facility]". [place] Oncol. 5 (6): 649-55    LABORATORY DATA:  No results found for: WBC, HGB, HCT, MCV, PLT No results found for: NA, K, CL, CO2 No results found for: ALT, AST, GGT, ALKPHOS, BILITOT    RADIOGRAPHY: CT  CARDIAC SCORING (SELF PAY ONLY)  Addendum Date: 07/13/2020   ADDENDUM REPORT: 07/13/2020 09:04 CLINICAL DATA:  Risk stratification EXAM: Coronary Calcium Score TECHNIQUE: The patient was scanned on a [employer] scanner. Axial non-contrast 3 [name] slices were carried out through the heart. The data set was analyzed on a dedicated work station and scored using the Carter. FINDINGS: Non-cardiac: See separate report from [facility]. Ascending Aorta: Mildly dilated at 61m (measurement at the main pulmonary artery bifurcation). Scattered calcifications. Pericardium: Normal Coronary arteries: Normal coronary  origins. IMPRESSION: Coronary calcium score of 19. This was 55th percentile for age and sex matched control. Aortic atherosclerosis with mildly dilated ascending aorta. Fransico Him Electronically Signed   By: Fransico Him   On: 07/13/2020 09:04   Addendum Date: 07/12/2020   ADDENDUM REPORT: 07/12/2020 22:25 CLINICAL DATA:  Risk stratification EXAM: Coronary Calcium Score TECHNIQUE: The patient was scanned on a [employer] scanner. Axial non-contrast 3 [name] slices were carried out through the heart. The data set was analyzed on a dedicated work station and scored using the Anderson. FINDINGS: Non-cardiac: See separate report from [facility]. Ascending Aorta: Dilated ascending aorta measuring 83m Pericardium: Normal Coronary arteries: Calcium score 19.  Calcified plaque in left main IMPRESSION: 1. Coronary calcium score of 19. This was 55th percentile for age and sex matched control. Calcified plaque in left main. 2. Dilated ascending aorta measuring 419mElectronically Signed   By: ChOswaldo MilianD   On: 07/12/2020 22:25   Result Date: 07/13/2020 EXAM: OVER-READ INTERPRETATION  CT CHEST The following report is an over-read performed [facility] radiologist Dr. [facility]f [facility] [place] 07/12/2020. This over-read does not include interpretation of cardiac or  coronary anatomy or pathology. The coronary calcium score interpretation by the cardiologist is attached. COMPARISON:  None. FINDINGS: Aortic atherosclerosis. Within the visualized portions of the thorax there are no suspicious appearing pulmonary nodules or masses, there is no acute consolidative airspace disease, no pleural effusions, no pneumothorax and no lymphadenopathy. Visualized portions of the upper abdomen are unremarkable. There are no aggressive appearing lytic or blastic lesions noted in the visualized portions of the skeleton. IMPRESSION: 1.  Aortic Atherosclerosis (ICD10-I70.0). Electronically Signed: By: DaVinnie Langton.D. On: 07/12/2020 15:32   [name] Breast Surgical Specimen  Result Date: 07/26/2020 CLINICAL DATA:  [age]ear old biopsy-proven high-grade DCIS involving the UPPER OUTER QUADRANT of the LEFT breast. Bracketed radioactive seed localization was performed yesterday in anticipation of today's lumpectomy. EXAM: SPECIMEN RADIOGRAPH OF THE LEFT BREAST COMPARISON:  Previous exam(s). FINDINGS: Status post excision of the LEFT breast. Radioactive seeds and the coil shaped biopsy marking clip are present in the non-compressed specimen. Both seeds are intact. This was discussed directly with the operating room nurse at the time of interpretation on 07/26/2020 at 11:55 a.m. IMPRESSION: Specimen radiograph of the LEFT breast. Electronically Signed   By: ThEvangeline Dakin.D.   On: 07/26/2020 11:58   [name] Digital Diagnostic Unilat L  Result Date: 07/04/2020 CLINICAL DATA:  Screening recall for left breast calcifications. EXAM: DIGITAL DIAGNOSTIC UNILATERAL LEFT MAMMOGRAM WITH CAD TECHNIQUE: Left digital diagnostic mammography was performed. Mammographic images were processed with CAD. COMPARISON:  Previous exams. ACR Breast Density Category c: The breast tissue is heterogeneously dense, which may obscure small masses. FINDINGS: Spot compression magnification views were performed over the  upper-outer posterior left breast demonstrating heterogeneous calcifications spanning approximately 2.3 cm. Serpiginous vascular calcifications are noted along the superolateral margin of these more suspicious appearing calcifications. IMPRESSION: Suspicious 2.3 cm group of calcifications in the upper-outer posterior left breast. RECOMMENDATION: Recommend stereotactic guided biopsy of the calcifications in the left breast. I have discussed the findings and recommendations with the patient. If applicable, a reminder letter will be sent to the patient regarding the next appointment. BI-RADS CATEGORY  4: Suspicious. Electronically Signed   By: JeEverlean Alstrom.D.   On: 07/04/2020 13:51   [name] LT RADIOACTIVE SEED LOC MAMMO GUIDE  Result Date: 07/25/2020 CLINICAL DATA:  Patient presents for radioactive seed  localization recently diagnosed left breast high-grade DCIS. Two seeds will be placed to bracket the extent of calcifications. EXAM: MAMMOGRAPHIC GUIDED RADIOACTIVE SEED LOCALIZATION OF THE LEFT BREAST: 2 RADIOACTIVE SEEDS PLACED COMPARISON:  Previous exam(s). FINDINGS: Patient presents for radioactive seed localization prior to surgical excision. I met with the patient and we discussed the procedure of seed localization including benefits and alternatives. We discussed the high likelihood of a successful procedure. We discussed the risks of the procedure including infection, bleeding, tissue injury and further surgery. We discussed the low dose of radioactivity involved in the procedure. Informed, written consent was given. The usual time-out protocol was performed immediately prior to the procedure. Anterior extent of calcifications Using mammographic guidance, sterile technique, 1% lidocaine and an I-125 radioactive seed, the anterior extent of the calcifications, 2.3 cm anterior and slightly medial to the coil shaped biopsy clip, was localized using a lateral approach. The follow-up mammogram images confirm  the seed in the expected location and were marked for Dr. Barry Dienes. Follow-up survey of the patient confirms presence of the radioactive seed. Order number of I-125 seed:  [id number]. Total activity:  0.488 millicurie reference Date: 07/07/2020 Posterior extent of calcifications Using mammographic guidance, sterile technique, 1% lidocaine and an I-125 radioactive seed, the posterior extent of the calcifications was localized using a lateral approach. The follow-up mammogram images confirm the seed in the expected location and were marked for Dr. Barry Dienes. Follow-up survey of the patient confirms presence of the radioactive seed. Order number of I-125 seed:  [id number]. Total activity:  8.882 millicurie reference Date: 07/07/2020 The patient tolerated the procedure well and was released from the [facility]. She was given instructions regarding seed removal. IMPRESSION: Radioactive seed localization of the left breast. Two seeds placed to bracket calcifications reflecting DCIS. No apparent complications. Electronically Signed   By: Lajean Manes M.D.   On: 07/25/2020 11:34   [name] LT RAD SEED EA ADD LESION LOC MAMMO  Result Date: 07/25/2020 CLINICAL DATA:  Patient presents for radioactive seed localization recently diagnosed left breast high-grade DCIS. Two seeds will be placed to bracket the extent of calcifications. EXAM: MAMMOGRAPHIC GUIDED RADIOACTIVE SEED LOCALIZATION OF THE LEFT BREAST: 2 RADIOACTIVE SEEDS PLACED COMPARISON:  Previous exam(s). FINDINGS: Patient presents for radioactive seed localization prior to surgical excision. I met with the patient and we discussed the procedure of seed localization including benefits and alternatives. We discussed the high likelihood of a successful procedure. We discussed the risks of the procedure including infection, bleeding, tissue injury and further surgery. We discussed the low dose of radioactivity involved in the procedure. Informed, written consent was given. The  usual time-out protocol was performed immediately prior to the procedure. Anterior extent of calcifications Using mammographic guidance, sterile technique, 1% lidocaine and an I-125 radioactive seed, the anterior extent of the calcifications, 2.3 cm anterior and slightly medial to the coil shaped biopsy clip, was localized using a lateral approach. The follow-up mammogram images confirm the seed in the expected location and were marked for Dr. Barry Dienes. Follow-up survey of the patient confirms presence of the radioactive seed. Order number of I-125 seed:  [id number]. Total activity:  1.505 millicurie reference Date: 07/07/2020 Posterior extent of calcifications Using mammographic guidance, sterile technique, 1% lidocaine and an I-125 radioactive seed, the posterior extent of the calcifications was localized using a lateral approach. The follow-up mammogram images confirm the seed in the expected location and were marked for Dr. Barry Dienes. Follow-up survey of the patient confirms presence of the radioactive  seed. Order number of I-125 seed:  [id number]. Total activity:  4.332 millicurie reference Date: 07/07/2020 The patient tolerated the procedure well and was released from the [facility]. She was given instructions regarding seed removal. IMPRESSION: Radioactive seed localization of the left breast. Two seeds placed to bracket calcifications reflecting DCIS. No apparent complications. Electronically Signed   By: Lajean Manes M.D.   On: 07/25/2020 11:34   [name] CLIP PLACEMENT LEFT  Result Date: 07/07/2020 CLINICAL DATA:  Evaluate COIL clip placement following 3D/stereotactic guided biopsy of UPPER-OUTER LEFT breast calcifications. EXAM: DIAGNOSTIC LEFT MAMMOGRAM POST STEREOTACTIC BIOPSY COMPARISON:  Previous exam(s). FINDINGS: Mammographic images were obtained following 3D/stereotactic guided biopsy of calcifications within the posterior UPPER-OUTER LEFT breast. The COIL biopsy marking clip is in expected position at  the site of biopsy. Please note that residual calcifications span a distance of 3 cm and extend 1.5 cm posteromedial, 1.5 cm anteromedial and 0.8 cm inferomedial to the COIL biopsy clip. IMPRESSION: Appropriate positioning of the COIL shaped biopsy marking clip at the site of biopsy in the UPPER OUTER LEFT breast. Residual calcifications span a distance of 3 cm on this exam. Final Assessment: Post Procedure Mammograms for Marker Placement Electronically Signed   By: Margarette Canada M.D.   On: 07/07/2020 10:08   [name] LT BREAST BX W LOC DEV 1ST LESION IMAGE BX SPEC STEREO GUIDE  Addendum Date: 07/08/2020   ADDENDUM REPORT: 07/08/2020 12:28 ADDENDUM: Pathology revealed HIGH GRADE DUCTAL CARCINOMA IN SITU WITH CALCIFICATIONS AND NECROSIS of the LEFT breast, upper outer. This was found to be concordant by Dr. Hassan Rowan. Pathology results were discussed with the patient by telephone. The patient reported doing well after the biopsy with tenderness at the site. Post biopsy instructions and care were reviewed and questions were answered. The patient was encouraged to call [facility] for any additional concerns. My direct phone number was provided. Surgical consultation has been arranged with Dr. Stark Klein at [facility] on July 15, 2020. Consideration for a bilateral breast MRI for further evaluation of extent of disease given the high grade histology. Pathology results reported by Terie Purser, RN on 07/08/2020. Electronically Signed   By: Margarette Canada M.D.   On: 07/08/2020 12:28   Result Date: 07/08/2020 CLINICAL DATA:  70 year old female for tissue sampling of UPPER OUTER LEFT breast calcifications. EXAM: LEFT BREAST STEREOTACTIC CORE NEEDLE BIOPSY COMPARISON:  Previous exams. FINDINGS: The patient and I discussed the procedure of stereotactic-guided biopsy including benefits and alternatives. We discussed the high likelihood of a successful procedure. We discussed the risks of the  procedure including infection, bleeding, tissue injury, clip migration, and inadequate sampling. Informed written consent was given. The usual time out protocol was performed immediately prior to the procedure. Using sterile technique and 1% Lidocaine with and without epinephrine as local anesthetic, under stereotactic guidance, a 9 gauge vacuum assisted device was used to perform core needle biopsy of calcifications within the UPPER OUTER LEFT breast using a SUPERIOR approach. Specimen radiograph was performed showing calcification. Specimens with calcifications are identified for pathology. Lesion quadrant: UPPER-OUTER LEFT breast At the conclusion of the procedure, a COIL tissue marker clip was deployed into the biopsy cavity. Follow-up 2-view mammogram was performed and dictated separately. IMPRESSION: Stereotactic-guided biopsy of UPPER OUTER LEFT breast calcifications. No apparent complications. Electronically Signed: By: Margarette Canada M.D. On: 07/07/2020 10:05       IMPRESSION/PLAN: 1. High-grade, ER/PR positive DCIS of the left breast. Dr.  Lisbeth Renshaw discusses the pathology findings and reviews the nature of early breast disease. Her pathology is pending.  She is healing from breast conservation surgery and we disucssed today the rationale for external radiotherapy to the breast  to reduce risks of local recurrence followed by antiestrogen therapy. We discussed the risks, benefits, short, and long term effects of radiotherapy, as well as the curative intent, and the patient is interested in proceeding. Dr. Lisbeth Renshaw discusses the delivery and logistics of radiotherapy and anticipates a course of 4 weeks of radiotherapy to the left breast with deep inspiration breath hold technique. Written consent is obtained and placed in the chart, a copy was provided to the patient. She will simulate on 08/17/20 at 8 am.  In a visit lasting 60 minutes, greater than 50% of the time was spent face to face reviewing her case, as  well as in preparation of, discussing, and coordinating the patient's care.  The above documentation reflects my direct findings during this shared patient visit. Please see the separate note by Dr. Lisbeth Renshaw on this date for the remainder of the patient's plan of care.    Carola Rhine, [facility]    **Disclaimer: This note was dictated with voice recognition software. Similar sounding words can inadvertently be transcribed and this note may contain transcription errors which may not have been corrected upon publication of note.**

## 2020-08-04 ENCOUNTER — Encounter: Payer: Self-pay | Admitting: Gastroenterology

## 2020-08-08 NOTE — Progress Notes (Signed)
[facility] CONSULT NOTE  Patient Care Team: Kathyrn Lass, MD as PCP - General (Family Medicine)  CHIEF COMPLAINTS/PURPOSE OF CONSULTATION:  Newly diagnosed left breast DCIS  HISTORY OF PRESENTING ILLNESS:  Janice [name] Jun 70 y.o. female is here because of recent diagnosis of DCIS of the left breast. Screening mammogram on 06/16/20 showed left breast calcifications. Diagnostic mammogram on 07/04/20 showed calcifications in the left breast spanning 2.3cm. Biopsy on 07/07/20 showed high grade DCIS, ER+ 100%, PR+ 70%. She underwent a left lumpectomy on 07/26/20 with Dr. Barry Dienes for which pathology showed high grade DCIS partially involving an intraductal papilloma, clear margins. She presents to the clinic today for initial evaluation and discussion of treatment options.   I reviewed her records extensively and collaborated the history with the patient.  SUMMARY OF ONCOLOGIC HISTORY: Oncology History  Ductal carcinoma in situ (DCIS) of left breast  07/07/2020 Cancer Staging   Staging form: Breast, AJCC 8th Edition - Clinical stage from 07/07/2020: Stage 0 (cTis (DCIS), cN0, cM0, ER+, PR+) - Signed by Gardenia Phlegm, NP on 07/13/2020 Nuclear grade: G3   07/13/2020 Initial Diagnosis   Screening mammogram showed left breast calcifications. Diagnostic mammogram showed calcifications in the left breast spanning 2.3cm. Biopsy on 07/07/20 showed high grade DCIS, ER+ 100%, PR+ 70%.    07/26/2020 Surgery   Left lumpectomy Barry Dienes): high grade DCIS partially involving an intraductal papilloma, clear margins.     MEDICAL HISTORY:  Past Medical History:  Diagnosis Date  . Cancer ([place]) 06/2020   left breast cancer  . Hypercholesterolemia     SURGICAL HISTORY: Past Surgical History:  Procedure Laterality Date  . BREAST BIOPSY Left    Pt unsure of date  over 15 years ago- [facility]   . BREAST EXCISIONAL BIOPSY Right    in her 20s  . breast lift Bilateral   . BREAST LUMPECTOMY  Right    benign  . BREAST LUMPECTOMY WITH RADIOACTIVE SEED LOCALIZATION Left 07/26/2020   Procedure: LEFT BREAST LUMPECTOMY WITH BRACKETED RADIOACTIVE SEED LOCALIZATION;  Surgeon: Stark Klein, MD;  Location: [facility];  Service: General;  Laterality: Left;  RNFA; START TIME OF 11:45 AM FOR 60 MINUTES ROOM 5 [facility]  . BUNIONECTOMY Right   . lower body lift    . MOHS SURGERY     bridge of nose  . REDUCTION MAMMAPLASTY Bilateral   . TONSILLECTOMY AND ADENOIDECTOMY     age 8  . TUBAL LIGATION      SOCIAL HISTORY: Social History   Socioeconomic History  . Marital status: Divorced    Spouse name: Not on file  . Number of children: Not on file  . Years of education: Not on file  . Highest education level: Not on file  Occupational History  . Not on file  Tobacco Use  . Smoking status: Never Smoker  . Smokeless tobacco: Never Used  Substance and Sexual Activity  . Alcohol use: Yes    Alcohol/week: 2.0 standard drinks    Types: 2 Glasses of wine per week    Comment: social  . Drug use: No  . Sexual activity: Yes    Birth control/protection: Post-menopausal, Surgical  Other Topics Concern  . Not on file  Social History Narrative  . Not on file   Social Determinants of Health   Financial Resource Strain: Not on file  Food Insecurity: Not on file  Transportation Needs: Not on file  Physical Activity: Not on file  Stress:  Not on file  Social Connections: Not on file  Intimate Partner Violence: Not At Risk  . Fear of Current or Ex-Partner: No  . Emotionally Abused: No  . Physically Abused: No  . Sexually Abused: No    FAMILY HISTORY: Family History  Problem Relation Age of Onset  . Colon cancer Neg Hx   . Colon polyps Neg Hx   . Esophageal cancer Neg Hx   . Rectal cancer Neg Hx   . Stomach cancer Neg Hx     ALLERGIES:  has No Known Allergies.  MEDICATIONS:  Current Outpatient Medications  Medication Sig Dispense Refill  . Apoaequorin  (PREVAGEN EXTRA STRENGTH) 20 MG CAPS See admin instructions.    Marland Kitchen b complex vitamins tablet Take 1 tablet by mouth daily.    . calcium-vitamin D (OSCAL WITH D) 500-200 MG-UNIT tablet Take 1 tablet by mouth.    . Coenzyme Q10 300 MG CAPS Take by mouth.    . Evening Primrose Oil 500 MG CAPS Take 1 capsule by mouth daily.    Donnie Aho (GLUCOSAMINE MSM COMPLEX PO) Take 1,500 mg by mouth daily.    . magnesium oxide (MAG-OX) 400 MG tablet Take 400 mg by mouth daily.    . Multiple Vitamin (MULTIVITAMIN) tablet Take 1 tablet by mouth daily.    . Multiple Vitamins-Minerals (HAIR SKIN AND NAILS FORMULA) TABS See admin instructions.    . Nutritional Supplements (ESTROVEN PO) Take by mouth daily.    Marland Kitchen OVER THE COUNTER MEDICATION Take 450 mg by mouth daily. Tumeric    . oxyCODONE (OXY IR/ROXICODONE) 5 MG immediate release tablet Take 1 tablet (5 mg total) by mouth every 6 (six) hours as needed for severe pain. 5 tablet 0  . Probiotic Product (PROBIOTIC DAILY PO) Take by mouth daily.    . rosuvastatin (CRESTOR) 5 MG tablet 1 tablet    . Spirulina 500 MG TABS Take 500 mg by mouth daily.    . Turmeric 500 MG CAPS Take 1,000 mg by mouth daily.    Marland Kitchen zinc gluconate 50 MG tablet Take 50 mg by mouth daily.     No current facility-administered medications for this visit.    REVIEW OF SYSTEMS:     Breast: Denies any palpable lumps or discharge All other systems were reviewed with the patient and are negative.  PHYSICAL EXAMINATION: ECOG PERFORMANCE STATUS: 1 - Symptomatic but completely ambulatory  Vitals:   08/09/20 1026  BP: 119/79  Pulse: 84  Resp: 20  Temp: 97.9 F (36.6 C)  SpO2: 100%   Filed Weights   08/09/20 1026  Weight: 128 lb 4.8 oz (58.2 kg)     RADIOGRAPHIC STUDIES: I have personally reviewed the radiological reports and agreed with the findings in the report.  ASSESSMENT AND PLAN:  Ductal carcinoma in situ (DCIS) of left breast 07/13/2020:Screening mammogram  showed left breast calcifications. Diagnostic mammogram showed calcifications in the left breast spanning 2.3cm. Biopsy on 07/07/20 showed high grade DCIS, ER+ 100%, PR+ 70%.  07/26/2020:Left lumpectomy Barry Dienes): high grade DCIS partially involving an intraductal papilloma, clear margins.  ER 100%, PR 70%  Pathology review: I discussed with the patient the difference between DCIS and invasive breast cancer. It is considered a precancerous lesion. DCIS is classified as a 0. It is generally detected through mammograms as calcifications. We discussed the significance of grades and its impact on prognosis. We also discussed the importance of ER and PR receptors and their implications to adjuvant treatment options. Prognosis  of DCIS dependence on grade, comedo necrosis. It is anticipated that if not treated, 20-30% of DCIS can develop into invasive breast cancer.  Recommendation: 1. Adjuvant radiation therapy 2. Followed by antiestrogen therapy with tamoxifen 5 years  Tamoxifen counseling: We discussed the risks and benefits of tamoxifen. These include but not limited to insomnia, hot flashes, mood changes, vaginal dryness, and weight gain. Although rare, serious side effects including endometrial cancer, risk of blood clots were also discussed. We strongly believe that the benefits far outweigh the risks. Patient understands these risks and consented to starting treatment. Planned treatment duration is 5 years.   Return to clinic after radiation to start antiestrogen therapy     All questions were answered. The patient knows to call the clinic with any problems, questions or concerns.   Rulon Eisenmenger, MD, MPH 08/09/2020    I, Molly Dorshimer, am acting as scribe for Nicholas Lose, MD.  I have reviewed the above documentation for accuracy and completeness, and I agree with the above.

## 2020-08-09 ENCOUNTER — Other Ambulatory Visit: Payer: Self-pay

## 2020-08-09 ENCOUNTER — Inpatient Hospital Stay: Payer: PPO | Attending: Hematology and Oncology | Admitting: Hematology and Oncology

## 2020-08-09 DIAGNOSIS — E7800 Pure hypercholesterolemia, unspecified: Secondary | ICD-10-CM | POA: Insufficient documentation

## 2020-08-09 DIAGNOSIS — Z170 Estrogen receptor positive status [ER+]: Secondary | ICD-10-CM | POA: Diagnosis not present

## 2020-08-09 DIAGNOSIS — Z79899 Other long term (current) drug therapy: Secondary | ICD-10-CM | POA: Diagnosis not present

## 2020-08-09 DIAGNOSIS — D0512 Intraductal carcinoma in situ of left breast: Secondary | ICD-10-CM | POA: Insufficient documentation

## 2020-08-09 MED ORDER — SAM-E 400 MG PO TABS: 1.0000 | ORAL_TABLET | Freq: Two times a day (BID) | ORAL | Status: DC

## 2020-08-09 MED ORDER — LINOLEIC ACID CONJUGATED 1000 MG PO CAPS: 4.0000 | ORAL_CAPSULE | Freq: Every day | ORAL | Status: AC

## 2020-08-09 NOTE — Assessment & Plan Note (Signed)
07/13/2020:Screening mammogram showed left breast calcifications. Diagnostic mammogram showed calcifications in the left breast spanning 2.3cm. Biopsy on 07/07/20 showed high grade DCIS, ER+ 100%, PR+ 70%.  07/26/2020:Left lumpectomy Barry Dienes): high grade DCIS partially involving an intraductal papilloma, clear margins.  ER 100%, PR 70%  Pathology review: I discussed with the patient the difference between DCIS and invasive breast cancer. It is considered a precancerous lesion. DCIS is classified as a 0. It is generally detected through mammograms as calcifications. We discussed the significance of grades and its impact on prognosis. We also discussed the importance of ER and PR receptors and their implications to adjuvant treatment options. Prognosis of DCIS dependence on grade, comedo necrosis. It is anticipated that if not treated, 20-30% of DCIS can develop into invasive breast cancer.  Recommendation: 1. Adjuvant radiation therapy 2. Followed by antiestrogen therapy with tamoxifen 5 years  Tamoxifen counseling: We discussed the risks and benefits of tamoxifen. These include but not limited to insomnia, hot flashes, mood changes, vaginal dryness, and weight gain. Although rare, serious side effects including endometrial cancer, risk of blood clots were also discussed. We strongly believe that the benefits far outweigh the risks. Patient understands these risks and consented to starting treatment. Planned treatment duration is 5 years.  Return to clinic after radiation to start antiestrogen therapy

## 2020-08-10 ENCOUNTER — Encounter: Payer: Self-pay | Admitting: *Deleted

## 2020-08-11 ENCOUNTER — Encounter: Payer: Self-pay | Admitting: Gastroenterology

## 2020-08-17 ENCOUNTER — Other Ambulatory Visit: Payer: Self-pay

## 2020-08-17 ENCOUNTER — Ambulatory Visit
Admission: RE | Admit: 2020-08-17 | Discharge: 2020-08-17 | Disposition: A | Discharge status: Home / Self Care | Payer: PPO | Source: Ambulatory Visit | Attending: Radiation Oncology | Admitting: Radiation Oncology

## 2020-08-17 DIAGNOSIS — Z170 Estrogen receptor positive status [ER+]: Secondary | ICD-10-CM | POA: Insufficient documentation

## 2020-08-17 DIAGNOSIS — Z510 Encounter for antineoplastic radiation therapy: Secondary | ICD-10-CM | POA: Insufficient documentation

## 2020-08-17 DIAGNOSIS — D0512 Intraductal carcinoma in situ of left breast: Secondary | ICD-10-CM | POA: Insufficient documentation

## 2020-08-22 ENCOUNTER — Ambulatory Visit: Payer: PPO | Admitting: Radiation Oncology

## 2020-08-23 ENCOUNTER — Encounter: Payer: Self-pay | Admitting: *Deleted

## 2020-08-24 ENCOUNTER — Telehealth: Payer: Self-pay | Admitting: Hematology and Oncology

## 2020-08-24 DIAGNOSIS — D0512 Intraductal carcinoma in situ of left breast: Secondary | ICD-10-CM | POA: Diagnosis not present

## 2020-08-24 DIAGNOSIS — Z170 Estrogen receptor positive status [ER+]: Secondary | ICD-10-CM | POA: Diagnosis not present

## 2020-08-24 DIAGNOSIS — Z510 Encounter for antineoplastic radiation therapy: Secondary | ICD-10-CM | POA: Diagnosis not present

## 2020-08-24 NOTE — Telephone Encounter (Signed)
Scheduled appt per 4/19 sch msg. Pt aware.  

## 2020-08-25 ENCOUNTER — Ambulatory Visit: Payer: PPO | Admitting: Radiation Oncology

## 2020-08-26 ENCOUNTER — Ambulatory Visit: Payer: PPO

## 2020-08-29 ENCOUNTER — Ambulatory Visit
Admission: RE | Admit: 2020-08-29 | Discharge: 2020-08-29 | Disposition: A | Discharge status: Home / Self Care | Payer: PPO | Source: Ambulatory Visit | Attending: Radiation Oncology | Admitting: Radiation Oncology

## 2020-08-29 ENCOUNTER — Other Ambulatory Visit: Payer: Self-pay

## 2020-08-29 ENCOUNTER — Ambulatory Visit: Payer: PPO

## 2020-08-29 DIAGNOSIS — Z510 Encounter for antineoplastic radiation therapy: Secondary | ICD-10-CM | POA: Diagnosis not present

## 2020-08-29 DIAGNOSIS — D0512 Intraductal carcinoma in situ of left breast: Secondary | ICD-10-CM | POA: Diagnosis not present

## 2020-08-29 DIAGNOSIS — Z170 Estrogen receptor positive status [ER+]: Secondary | ICD-10-CM | POA: Diagnosis not present

## 2020-08-30 ENCOUNTER — Ambulatory Visit
Admission: RE | Admit: 2020-08-30 | Discharge: 2020-08-30 | Disposition: A | Discharge status: Home / Self Care | Payer: PPO | Source: Ambulatory Visit | Attending: Radiation Oncology | Admitting: Radiation Oncology

## 2020-08-30 DIAGNOSIS — D0512 Intraductal carcinoma in situ of left breast: Secondary | ICD-10-CM | POA: Diagnosis not present

## 2020-08-30 DIAGNOSIS — Z510 Encounter for antineoplastic radiation therapy: Secondary | ICD-10-CM | POA: Diagnosis not present

## 2020-08-30 DIAGNOSIS — Z170 Estrogen receptor positive status [ER+]: Secondary | ICD-10-CM | POA: Diagnosis not present

## 2020-08-31 ENCOUNTER — Ambulatory Visit
Admission: RE | Admit: 2020-08-31 | Discharge: 2020-08-31 | Disposition: A | Discharge status: Home / Self Care | Payer: PPO | Source: Ambulatory Visit | Attending: Radiation Oncology | Admitting: Radiation Oncology

## 2020-08-31 ENCOUNTER — Other Ambulatory Visit: Payer: Self-pay

## 2020-08-31 DIAGNOSIS — Z170 Estrogen receptor positive status [ER+]: Secondary | ICD-10-CM | POA: Diagnosis not present

## 2020-08-31 DIAGNOSIS — D0512 Intraductal carcinoma in situ of left breast: Secondary | ICD-10-CM | POA: Diagnosis not present

## 2020-08-31 DIAGNOSIS — Z510 Encounter for antineoplastic radiation therapy: Secondary | ICD-10-CM | POA: Diagnosis not present

## 2020-09-01 ENCOUNTER — Ambulatory Visit
Admission: RE | Admit: 2020-09-01 | Discharge: 2020-09-01 | Disposition: A | Discharge status: Home / Self Care | Payer: PPO | Source: Ambulatory Visit | Attending: Radiation Oncology | Admitting: Radiation Oncology

## 2020-09-01 DIAGNOSIS — D0512 Intraductal carcinoma in situ of left breast: Secondary | ICD-10-CM | POA: Diagnosis not present

## 2020-09-01 DIAGNOSIS — Z170 Estrogen receptor positive status [ER+]: Secondary | ICD-10-CM | POA: Diagnosis not present

## 2020-09-01 DIAGNOSIS — Z510 Encounter for antineoplastic radiation therapy: Secondary | ICD-10-CM | POA: Diagnosis not present

## 2020-09-02 ENCOUNTER — Ambulatory Visit
Admission: RE | Admit: 2020-09-02 | Discharge: 2020-09-02 | Disposition: A | Discharge status: Home / Self Care | Payer: PPO | Source: Ambulatory Visit | Attending: Radiation Oncology | Admitting: Radiation Oncology

## 2020-09-02 ENCOUNTER — Other Ambulatory Visit: Payer: Self-pay

## 2020-09-02 DIAGNOSIS — Z170 Estrogen receptor positive status [ER+]: Secondary | ICD-10-CM | POA: Diagnosis not present

## 2020-09-02 DIAGNOSIS — D0512 Intraductal carcinoma in situ of left breast: Secondary | ICD-10-CM | POA: Diagnosis not present

## 2020-09-02 DIAGNOSIS — Z510 Encounter for antineoplastic radiation therapy: Secondary | ICD-10-CM | POA: Diagnosis not present

## 2020-09-02 MED ORDER — ALRA NON-METALLIC DEODORANT (RAD-ONC)
1.0000 "application " | Freq: Once | TOPICAL | Status: AC
  Administered 2020-09-02: 1 via TOPICAL

## 2020-09-02 MED ORDER — RADIAPLEXRX EX GEL: Freq: Once | CUTANEOUS | Status: AC

## 2020-09-02 NOTE — Progress Notes (Signed)
Pt here for patient teaching.  Pt given Radiation and You booklet, skin care instructions, Alra deodorant and Radiaplex gel.  Reviewed areas of pertinence such as fatigue, hair loss, skin changes, breast tenderness and breast swelling . Pt able to give teach back of to pat skin and use unscented/gentle soap,apply Radiaplex bid, avoid applying anything to skin within 4 hours of treatment, avoid wearing an under wire bra and to use an electric razor if they must shave. Pt verbalizes understanding of information given and will contact nursing with any questions or concerns.     Laketa Sandoz M. Asmaa Tirpak RN, BSN      

## 2020-09-05 ENCOUNTER — Other Ambulatory Visit: Payer: Self-pay

## 2020-09-05 ENCOUNTER — Ambulatory Visit
Admission: RE | Admit: 2020-09-05 | Discharge: 2020-09-05 | Disposition: A | Discharge status: Home / Self Care | Payer: PPO | Source: Ambulatory Visit | Attending: Radiation Oncology | Admitting: Radiation Oncology

## 2020-09-05 DIAGNOSIS — D0512 Intraductal carcinoma in situ of left breast: Secondary | ICD-10-CM | POA: Diagnosis not present

## 2020-09-05 DIAGNOSIS — Z510 Encounter for antineoplastic radiation therapy: Secondary | ICD-10-CM | POA: Diagnosis not present

## 2020-09-05 DIAGNOSIS — Z170 Estrogen receptor positive status [ER+]: Secondary | ICD-10-CM | POA: Insufficient documentation

## 2020-09-06 ENCOUNTER — Ambulatory Visit
Admission: RE | Admit: 2020-09-06 | Discharge: 2020-09-06 | Disposition: A | Discharge status: Home / Self Care | Payer: PPO | Source: Ambulatory Visit | Attending: Radiation Oncology | Admitting: Radiation Oncology

## 2020-09-06 DIAGNOSIS — Z170 Estrogen receptor positive status [ER+]: Secondary | ICD-10-CM | POA: Diagnosis not present

## 2020-09-06 DIAGNOSIS — D0512 Intraductal carcinoma in situ of left breast: Secondary | ICD-10-CM | POA: Diagnosis not present

## 2020-09-06 DIAGNOSIS — Z510 Encounter for antineoplastic radiation therapy: Secondary | ICD-10-CM | POA: Diagnosis not present

## 2020-09-07 ENCOUNTER — Other Ambulatory Visit: Payer: Self-pay

## 2020-09-07 ENCOUNTER — Ambulatory Visit
Admission: RE | Admit: 2020-09-07 | Discharge: 2020-09-07 | Disposition: A | Discharge status: Home / Self Care | Payer: PPO | Source: Ambulatory Visit | Attending: Radiation Oncology | Admitting: Radiation Oncology

## 2020-09-07 DIAGNOSIS — Z510 Encounter for antineoplastic radiation therapy: Secondary | ICD-10-CM | POA: Diagnosis not present

## 2020-09-07 DIAGNOSIS — D0512 Intraductal carcinoma in situ of left breast: Secondary | ICD-10-CM | POA: Diagnosis not present

## 2020-09-07 DIAGNOSIS — Z170 Estrogen receptor positive status [ER+]: Secondary | ICD-10-CM | POA: Diagnosis not present

## 2020-09-08 ENCOUNTER — Ambulatory Visit
Admission: RE | Admit: 2020-09-08 | Discharge: 2020-09-08 | Disposition: A | Discharge status: Home / Self Care | Payer: PPO | Source: Ambulatory Visit | Attending: Radiation Oncology | Admitting: Radiation Oncology

## 2020-09-08 DIAGNOSIS — D0512 Intraductal carcinoma in situ of left breast: Secondary | ICD-10-CM | POA: Diagnosis not present

## 2020-09-08 DIAGNOSIS — Z510 Encounter for antineoplastic radiation therapy: Secondary | ICD-10-CM | POA: Diagnosis not present

## 2020-09-08 DIAGNOSIS — Z170 Estrogen receptor positive status [ER+]: Secondary | ICD-10-CM | POA: Diagnosis not present

## 2020-09-09 ENCOUNTER — Ambulatory Visit: Payer: PPO | Admitting: Radiation Oncology

## 2020-09-09 ENCOUNTER — Ambulatory Visit
Admission: RE | Admit: 2020-09-09 | Discharge: 2020-09-09 | Disposition: A | Discharge status: Home / Self Care | Payer: PPO | Source: Ambulatory Visit | Attending: Radiation Oncology | Admitting: Radiation Oncology

## 2020-09-09 ENCOUNTER — Other Ambulatory Visit: Payer: Self-pay

## 2020-09-09 DIAGNOSIS — D0512 Intraductal carcinoma in situ of left breast: Secondary | ICD-10-CM | POA: Diagnosis not present

## 2020-09-09 DIAGNOSIS — Z170 Estrogen receptor positive status [ER+]: Secondary | ICD-10-CM | POA: Diagnosis not present

## 2020-09-09 DIAGNOSIS — Z510 Encounter for antineoplastic radiation therapy: Secondary | ICD-10-CM | POA: Diagnosis not present

## 2020-09-12 ENCOUNTER — Ambulatory Visit
Admission: RE | Admit: 2020-09-12 | Discharge: 2020-09-12 | Disposition: A | Discharge status: Home / Self Care | Payer: PPO | Source: Ambulatory Visit | Attending: Radiation Oncology | Admitting: Radiation Oncology

## 2020-09-12 ENCOUNTER — Other Ambulatory Visit: Payer: Self-pay

## 2020-09-12 DIAGNOSIS — D0512 Intraductal carcinoma in situ of left breast: Secondary | ICD-10-CM | POA: Diagnosis not present

## 2020-09-12 DIAGNOSIS — Z510 Encounter for antineoplastic radiation therapy: Secondary | ICD-10-CM | POA: Diagnosis not present

## 2020-09-12 DIAGNOSIS — Z170 Estrogen receptor positive status [ER+]: Secondary | ICD-10-CM | POA: Diagnosis not present

## 2020-09-13 ENCOUNTER — Ambulatory Visit
Admission: RE | Admit: 2020-09-13 | Discharge: 2020-09-13 | Disposition: A | Discharge status: Home / Self Care | Payer: PPO | Source: Ambulatory Visit | Attending: Radiation Oncology | Admitting: Radiation Oncology

## 2020-09-13 DIAGNOSIS — Z510 Encounter for antineoplastic radiation therapy: Secondary | ICD-10-CM | POA: Diagnosis not present

## 2020-09-13 DIAGNOSIS — D0512 Intraductal carcinoma in situ of left breast: Secondary | ICD-10-CM | POA: Diagnosis not present

## 2020-09-13 DIAGNOSIS — Z170 Estrogen receptor positive status [ER+]: Secondary | ICD-10-CM | POA: Diagnosis not present

## 2020-09-14 ENCOUNTER — Telehealth: Payer: Self-pay | Admitting: Radiation Oncology

## 2020-09-14 ENCOUNTER — Ambulatory Visit
Admission: RE | Admit: 2020-09-14 | Discharge: 2020-09-14 | Disposition: A | Discharge status: Home / Self Care | Payer: PPO | Source: Ambulatory Visit | Attending: Radiation Oncology | Admitting: Radiation Oncology

## 2020-09-14 ENCOUNTER — Other Ambulatory Visit: Payer: Self-pay

## 2020-09-14 DIAGNOSIS — Z510 Encounter for antineoplastic radiation therapy: Secondary | ICD-10-CM | POA: Diagnosis not present

## 2020-09-14 DIAGNOSIS — D0512 Intraductal carcinoma in situ of left breast: Secondary | ICD-10-CM

## 2020-09-14 DIAGNOSIS — Z170 Estrogen receptor positive status [ER+]: Secondary | ICD-10-CM | POA: Diagnosis not present

## 2020-09-14 NOTE — Telephone Encounter (Signed)
The patient called back about referral to genetic testing and is interested in proceeding.

## 2020-09-14 NOTE — Telephone Encounter (Signed)
I called and left a message for the patient to let me know if she'd like genetic testing performed. She had asked Dr. Lisbeth Renshaw about this. Genetic testing is more affordable even if not covered by insurance for patients who may not have a family history. I encouraged her to call me back if she is interested in a referral.

## 2020-09-14 NOTE — Addendum Note (Signed)
Addended by: Shona Simpson C on: 09/14/2020 05:00 PM   Modules accepted: Orders

## 2020-09-15 ENCOUNTER — Telehealth: Payer: Self-pay | Admitting: Radiation Oncology

## 2020-09-15 ENCOUNTER — Ambulatory Visit
Admission: RE | Admit: 2020-09-15 | Discharge: 2020-09-15 | Disposition: A | Discharge status: Home / Self Care | Payer: PPO | Source: Ambulatory Visit | Attending: Radiation Oncology | Admitting: Radiation Oncology

## 2020-09-15 DIAGNOSIS — Z510 Encounter for antineoplastic radiation therapy: Secondary | ICD-10-CM | POA: Diagnosis not present

## 2020-09-15 DIAGNOSIS — Z170 Estrogen receptor positive status [ER+]: Secondary | ICD-10-CM | POA: Diagnosis not present

## 2020-09-15 DIAGNOSIS — D0512 Intraductal carcinoma in situ of left breast: Secondary | ICD-10-CM | POA: Diagnosis not present

## 2020-09-15 NOTE — Telephone Encounter (Signed)
I called the patient and she desires formal genetic testing. A referral will be placed. She's aware that insurance would not likely cover this, but that testing costs have improved greatly.

## 2020-09-16 ENCOUNTER — Ambulatory Visit: Payer: PPO | Admitting: Radiation Oncology

## 2020-09-16 ENCOUNTER — Ambulatory Visit: Payer: PPO

## 2020-09-16 ENCOUNTER — Ambulatory Visit
Admission: RE | Admit: 2020-09-16 | Discharge: 2020-09-16 | Disposition: A | Discharge status: Home / Self Care | Payer: PPO | Source: Ambulatory Visit | Attending: Radiation Oncology | Admitting: Radiation Oncology

## 2020-09-16 ENCOUNTER — Other Ambulatory Visit: Payer: Self-pay

## 2020-09-16 DIAGNOSIS — Z170 Estrogen receptor positive status [ER+]: Secondary | ICD-10-CM | POA: Diagnosis not present

## 2020-09-16 DIAGNOSIS — Z510 Encounter for antineoplastic radiation therapy: Secondary | ICD-10-CM | POA: Diagnosis not present

## 2020-09-16 DIAGNOSIS — D0512 Intraductal carcinoma in situ of left breast: Secondary | ICD-10-CM | POA: Diagnosis not present

## 2020-09-19 ENCOUNTER — Ambulatory Visit: Payer: PPO

## 2020-09-19 ENCOUNTER — Ambulatory Visit
Admission: RE | Admit: 2020-09-19 | Discharge: 2020-09-19 | Disposition: A | Discharge status: Home / Self Care | Payer: PPO | Source: Ambulatory Visit | Attending: Radiation Oncology | Admitting: Radiation Oncology

## 2020-09-19 DIAGNOSIS — Z510 Encounter for antineoplastic radiation therapy: Secondary | ICD-10-CM | POA: Diagnosis not present

## 2020-09-19 DIAGNOSIS — Z170 Estrogen receptor positive status [ER+]: Secondary | ICD-10-CM | POA: Diagnosis not present

## 2020-09-19 DIAGNOSIS — D0512 Intraductal carcinoma in situ of left breast: Secondary | ICD-10-CM | POA: Diagnosis not present

## 2020-09-19 NOTE — Progress Notes (Signed)
Patient Care Team: Kathyrn Lass, MD as PCP - General (Family Medicine) Mauro Kaufmann, RN as Oncology Nurse Navigator Rockwell Germany, RN as Oncology Nurse Navigator  DIAGNOSIS:    ICD-10-CM   1. Ductal carcinoma in situ (DCIS) of left breast  D05.12     SUMMARY OF ONCOLOGIC HISTORY: Oncology History  Ductal carcinoma in situ (DCIS) of left breast  07/07/2020 Cancer Staging   Staging form: Breast, AJCC 8th Edition - Clinical stage from 07/07/2020: Stage 0 (cTis (DCIS), cN0, cM0, ER+, PR+) - Signed by Gardenia Phlegm, NP on 07/13/2020 Nuclear grade: G3   07/13/2020 Initial Diagnosis   Screening mammogram showed left breast calcifications. Diagnostic mammogram showed calcifications in the left breast spanning 2.3cm. Biopsy on 07/07/20 showed high grade DCIS, ER+ 100%, PR+ 70%.    07/26/2020 Surgery   Left lumpectomy Barry Dienes): high grade DCIS partially involving an intraductal papilloma, clear margins.   07/26/2020 Cancer Staging   Staging form: Breast, AJCC 8th Edition - Pathologic stage from 07/26/2020: Stage 0 (pTis (DCIS), pN0, cM0, ER+, PR+) - Signed by Gardenia Phlegm, NP on 08/10/2020 Stage prefix: Initial diagnosis   08/30/2020 - 09/23/2020 Radiation Therapy   Adjuvant radiation     CHIEF COMPLIANT: Follow-up of to discuss antiestrogen therapy  INTERVAL HISTORY: Janice [name] is a 70 y.o. with above-mentioned history of left breast DCIS who underwent a left lumpectomy and is currently on radiation. She presents to the clinic today to discuss antiestrogen therapy.  She is doing quite well from radiation without any major problems or concerns.  She does have mild radiation dermatitis and mild fatigue.  However in spite of this she is able to walk 4 and half miles every day.  ALLERGIES:  has No Known Allergies.  MEDICATIONS:  Current Outpatient Medications  Medication Sig Dispense Refill  . [START ON 10/05/2020] tamoxifen (NOLVADEX) 20 MG tablet Take 1  tablet (20 mg total) by mouth daily. 90 tablet 3  . Apoaequorin (PREVAGEN EXTRA STRENGTH) 20 MG CAPS See admin instructions.    Marland Kitchen b complex vitamins tablet Take 1 tablet by mouth daily.    . calcium-vitamin D (OSCAL WITH D) 500-200 MG-UNIT tablet Take 1 tablet by mouth.    . Coenzyme Q10 300 MG CAPS Take by mouth.    . Evening Primrose Oil 500 MG CAPS Take 1 capsule by mouth daily.    Donnie Aho (GLUCOSAMINE MSM COMPLEX PO) Take 1,500 mg by mouth daily.    . Linoleic Acid Conjugated 1000 MG CAPS Take 4 capsules (4,000 mg total) by mouth daily.    . magnesium oxide (MAG-OX) 400 MG tablet Take 400 mg by mouth daily.    . Multiple Vitamin (MULTIVITAMIN) tablet Take 1 tablet by mouth daily.    . Multiple Vitamins-Minerals (HAIR SKIN AND NAILS FORMULA) TABS See admin instructions.    . Nutritional Supplements (ESTROVEN PO) Take by mouth daily.    Marland Kitchen OVER THE COUNTER MEDICATION Take 450 mg by mouth daily. Tumeric    . Probiotic Product (PROBIOTIC DAILY PO) Take by mouth daily.    . S-Adenosylmethionine (SAM-E) 400 MG TABS Take 1 tablet (400 mg total) by mouth in the morning and at bedtime.    Marland Kitchen Spirulina 500 MG TABS Take 500 mg by mouth daily.    . Turmeric 500 MG CAPS Take 1,000 mg by mouth daily.    Marland Kitchen zinc gluconate 50 MG tablet Take 50 mg by mouth daily.     No  current facility-administered medications for this visit.    PHYSICAL EXAMINATION: ECOG PERFORMANCE STATUS: 1 - Symptomatic but completely ambulatory  Vitals:   09/20/20 0828  BP: 127/83  Pulse: 72  Resp: 18  Temp: (!) 97.5 F (36.4 C)  SpO2: 100%   Filed Weights   09/20/20 0828  Weight: 130 lb 14.4 oz (59.4 kg)    BREAST: No palpable masses or nodules in either right or left breasts. No palpable axillary supraclavicular or infraclavicular adenopathy no breast tenderness or nipple discharge. (exam performed in the presence of a chaperone)  LABORATORY DATA:  I have reviewed the data as listed No  flowsheet data found.  No results found for: WBC, HGB, HCT, MCV, PLT, NEUTROABS  ASSESSMENT & PLAN:  Ductal carcinoma in situ (DCIS) of left breast 07/13/2020:Screening mammogram showed left breast calcifications. Diagnostic mammogram showed calcifications in the left breast spanning 2.3cm. Biopsy on 07/07/20 showed high grade DCIS, ER+ 100%, PR+ 70%.  07/26/2020:Left lumpectomy Barry Dienes): high grade DCIS partially involving an intraductal papilloma, clear margins.  ER 100%, PR 70% Adjuvant radiation: 08/30/2020 to 09/23/2020  Treatment plan: Adjuvant antiestrogen therapy with tamoxifen 20 mg once daily x5 years will start 10/05/2020 She will start taking half a tablet daily for a week and if she tolerates it well then she will increase it to the full tablet. Once again discussed the risks and benefits of tamoxifen therapy.  For hot flashes that she normally takes [place] which does contain black cohosh.  I discussed with her that there is a lot of controversy surrounding black cohosh and risk of breast cancer.  However this is helped her significantly over the years and we decided to continue with it.  She walks 4 and half to 6 miles every day. Return to clinic in 3 months for survivorship care plan visit    No orders of the defined types were placed in this encounter.  The patient has a good understanding of the overall plan. she agrees with it. she will call with any problems that may develop before the next visit here.  Total time spent: 20 mins including face to face time and time spent for planning, charting and coordination of care  Rulon Eisenmenger, MD, MPH 09/20/2020  I, Cloyde Reams Dorshimer, am acting as scribe for Dr. Nicholas Lose.  I have reviewed the above documentation for accuracy and completeness, and I agree with the above.

## 2020-09-20 ENCOUNTER — Inpatient Hospital Stay: Payer: PPO | Attending: Hematology and Oncology | Admitting: Hematology and Oncology

## 2020-09-20 ENCOUNTER — Ambulatory Visit
Admission: RE | Admit: 2020-09-20 | Discharge: 2020-09-20 | Disposition: A | Discharge status: Home / Self Care | Payer: PPO | Source: Ambulatory Visit | Attending: Radiation Oncology | Admitting: Radiation Oncology

## 2020-09-20 ENCOUNTER — Other Ambulatory Visit: Payer: Self-pay

## 2020-09-20 ENCOUNTER — Ambulatory Visit: Payer: PPO

## 2020-09-20 DIAGNOSIS — R5383 Other fatigue: Secondary | ICD-10-CM | POA: Insufficient documentation

## 2020-09-20 DIAGNOSIS — D0512 Intraductal carcinoma in situ of left breast: Secondary | ICD-10-CM | POA: Insufficient documentation

## 2020-09-20 DIAGNOSIS — Z510 Encounter for antineoplastic radiation therapy: Secondary | ICD-10-CM | POA: Diagnosis not present

## 2020-09-20 DIAGNOSIS — Z79899 Other long term (current) drug therapy: Secondary | ICD-10-CM | POA: Diagnosis not present

## 2020-09-20 DIAGNOSIS — Z923 Personal history of irradiation: Secondary | ICD-10-CM | POA: Insufficient documentation

## 2020-09-20 DIAGNOSIS — L598 Other specified disorders of the skin and subcutaneous tissue related to radiation: Secondary | ICD-10-CM | POA: Insufficient documentation

## 2020-09-20 DIAGNOSIS — Z170 Estrogen receptor positive status [ER+]: Secondary | ICD-10-CM | POA: Diagnosis not present

## 2020-09-20 MED ORDER — TAMOXIFEN CITRATE 20 MG PO TABS: 20.0000 mg | ORAL_TABLET | Freq: Every day | ORAL | 3 refills | Status: DC

## 2020-09-20 NOTE — Assessment & Plan Note (Signed)
07/13/2020:Screening mammogram showed left breast calcifications. Diagnostic mammogram showed calcifications in the left breast spanning 2.3cm. Biopsy on 07/07/20 showed high grade DCIS, ER+ 100%, PR+ 70%.  07/26/2020:Left lumpectomy Barry Dienes): high grade DCIS partially involving an intraductal papilloma, clear margins.  ER 100%, PR 70% Adjuvant radiation: 08/30/2020 to 09/23/2020  Treatment plan: Adjuvant antiestrogen therapy with tamoxifen 20 mg once daily x5 years will start 10/05/2020 Once again discussed the risks and benefits of tamoxifen therapy.  Return to clinic in 3 months for survivorship care plan visit

## 2020-09-21 ENCOUNTER — Ambulatory Visit: Payer: PPO

## 2020-09-21 ENCOUNTER — Ambulatory Visit
Admission: RE | Admit: 2020-09-21 | Discharge: 2020-09-21 | Disposition: A | Discharge status: Home / Self Care | Payer: PPO | Source: Ambulatory Visit | Attending: Radiation Oncology | Admitting: Radiation Oncology

## 2020-09-21 ENCOUNTER — Ambulatory Visit: Payer: PPO | Admitting: Gastroenterology

## 2020-09-21 DIAGNOSIS — Z170 Estrogen receptor positive status [ER+]: Secondary | ICD-10-CM | POA: Diagnosis not present

## 2020-09-21 DIAGNOSIS — D0512 Intraductal carcinoma in situ of left breast: Secondary | ICD-10-CM | POA: Diagnosis not present

## 2020-09-21 DIAGNOSIS — Z510 Encounter for antineoplastic radiation therapy: Secondary | ICD-10-CM | POA: Diagnosis not present

## 2020-09-22 ENCOUNTER — Encounter: Payer: Self-pay | Admitting: *Deleted

## 2020-09-22 ENCOUNTER — Ambulatory Visit: Payer: PPO

## 2020-09-22 ENCOUNTER — Ambulatory Visit
Admission: RE | Admit: 2020-09-22 | Discharge: 2020-09-22 | Disposition: A | Discharge status: Home / Self Care | Payer: PPO | Source: Ambulatory Visit | Attending: Radiation Oncology | Admitting: Radiation Oncology

## 2020-09-22 DIAGNOSIS — Z510 Encounter for antineoplastic radiation therapy: Secondary | ICD-10-CM | POA: Diagnosis not present

## 2020-09-22 DIAGNOSIS — Z170 Estrogen receptor positive status [ER+]: Secondary | ICD-10-CM | POA: Diagnosis not present

## 2020-09-22 DIAGNOSIS — D0512 Intraductal carcinoma in situ of left breast: Secondary | ICD-10-CM | POA: Diagnosis not present

## 2020-09-23 ENCOUNTER — Ambulatory Visit
Admission: RE | Admit: 2020-09-23 | Discharge: 2020-09-23 | Disposition: A | Discharge status: Home / Self Care | Payer: PPO | Source: Ambulatory Visit | Attending: Radiation Oncology | Admitting: Radiation Oncology

## 2020-09-23 ENCOUNTER — Other Ambulatory Visit: Payer: Self-pay

## 2020-09-23 ENCOUNTER — Encounter: Payer: Self-pay | Admitting: Radiation Oncology

## 2020-09-23 DIAGNOSIS — D0512 Intraductal carcinoma in situ of left breast: Secondary | ICD-10-CM | POA: Diagnosis not present

## 2020-09-23 DIAGNOSIS — Z170 Estrogen receptor positive status [ER+]: Secondary | ICD-10-CM | POA: Diagnosis not present

## 2020-09-23 DIAGNOSIS — Z510 Encounter for antineoplastic radiation therapy: Secondary | ICD-10-CM | POA: Diagnosis not present

## 2020-09-26 NOTE — Progress Notes (Signed)
  Patient Name: Janice [name] MRN: [id number] DOB: [birth year] Referring Physician: Stark Klein (Profile Not Attached) Date of Service: 09/23/2020 [facility], [place]                                                        End Of Treatment Note  Diagnoses: D05.12-Intraductal carcinoma in situ of left breast Z17.0-Estrogen receptor positive status [ER+]  Cancer Staging:  High-grade, ER/PR positive DCIS of the left breast  Intent: Curative  Radiation Treatment Dates: 08/29/2020 through 09/23/2020 Site Technique Total Dose (Gy) Dose per Fx (Gy) Completed Fx Beam Energies  Breast, Left: Breast_Lt 3D 42.56/42.56 2.66 16/16 6X  Breast, Left: Breast_Lt_Bst 3D 10/10 2.5 4/4 6X   Narrative: The patient tolerated radiation therapy relatively well. She did have anticipated skin changes in the treatment field and some fatigue due to treatment.  Plan: The patient will receive a call in about one month from the radiation oncology department. She will continue follow up with Dr. Lindi Adie as well.   ________________________________________________    Carola Rhine, [facility]

## 2020-09-28 ENCOUNTER — Ambulatory Visit (INDEPENDENT_AMBULATORY_CARE_PROVIDER_SITE_OTHER): Payer: PPO | Admitting: Gastroenterology

## 2020-09-28 ENCOUNTER — Encounter: Payer: Self-pay | Admitting: Gastroenterology

## 2020-09-28 VITALS — BP 110/80 | HR 76 | Ht 63.0 in | Wt 131.6 lb

## 2020-09-28 DIAGNOSIS — Z86010 Personal history of colonic polyps: Secondary | ICD-10-CM

## 2020-09-28 NOTE — Progress Notes (Signed)
Review of pertinent gastrointestinal problems: 1.  History of adenomatous colon polyps.  Colonoscopy 2006 Dr. Verl Blalock, no polyps or cancers.  Colonoscopy December 2016 Dr. Ardis Hughs found a single 2 mm tubular adenoma, left-sided diverticulosis.  Originally recommended to have repeat colonoscopy at 5-year interval.  Newest colorectal cancer, polyp surveillance and screening guidelines show that 7 years is a more appropriate interval.   HPI: [name]  She was recent diagnosed with breast cancer and underwent lumpectomy and completed radiation therapy.  She is going to be on tamoxifen for about 5 years.  She has Apsley no troubles with her bowels.  No significant constipation, no diarrhea, no bleeding.  No serious abdominal pains.  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  She has a Nurse, learning disability.   Review of systems: Pertinent positive and negative review of systems were noted in the above HPI section. All other review negative.   Past Medical History:  Diagnosis Date  . Cancer ([place]) 06/2020   left breast cancer  . Hypercholesterolemia     Past Surgical History:  Procedure Laterality Date  . BREAST BIOPSY Left    Pt unsure of date  over 15 years ago- [facility]   . BREAST EXCISIONAL BIOPSY Right    in her 20s  . breast lift Bilateral   . BREAST LUMPECTOMY Right    benign  . BREAST LUMPECTOMY WITH RADIOACTIVE SEED LOCALIZATION Left 07/26/2020   Procedure: LEFT BREAST LUMPECTOMY WITH BRACKETED RADIOACTIVE SEED LOCALIZATION;  Surgeon: Stark Klein, MD;  Location: [facility];  Service: General;  Laterality: Left;  RNFA; START TIME OF 11:45 AM FOR 60 MINUTES ROOM 5 BYERLY IQ  . BUNIONECTOMY Right   . lower body  lift    . MOHS SURGERY     bridge of nose  . REDUCTION MAMMAPLASTY Bilateral   . TONSILLECTOMY AND ADENOIDECTOMY     age 53  . TUBAL LIGATION      Current Outpatient Medications  Medication Sig Dispense Refill  . Apoaequorin (PREVAGEN EXTRA STRENGTH) 20 MG CAPS See admin instructions.    Marland Kitchen b complex vitamins tablet Take 1 tablet by mouth daily.    . calcium-vitamin D (OSCAL WITH D) 500-200 MG-UNIT tablet Take 1 tablet by mouth.    . Coenzyme Q10 300 MG CAPS Take by mouth.    . Evening Primrose Oil 500 MG CAPS Take 1 capsule by mouth daily.    Donnie Aho (GLUCOSAMINE MSM COMPLEX PO) Take 1,500 mg by mouth daily.    . Linoleic Acid Conjugated 1000 MG CAPS Take 4 capsules (4,000 mg total) by mouth daily.    . magnesium oxide (MAG-OX) 400 MG tablet Take 400 mg by mouth daily.    . Multiple Vitamin (MULTIVITAMIN) tablet Take 1 tablet by mouth daily.    . Multiple Vitamins-Minerals (HAIR SKIN AND NAILS FORMULA) TABS See admin instructions.    . Nutritional Supplements (ESTROVEN PO) Take by mouth daily.    . Probiotic Product (PROBIOTIC DAILY PO) Take by mouth daily.    . S-Adenosylmethionine (SAM-E) 400 MG TABS Take 1 tablet (400 mg total) by mouth in the morning and at bedtime.    Marland Kitchen Spirulina 500 MG  TABS Take 500 mg by mouth daily.    Derrill Memo ON 10/05/2020] tamoxifen (NOLVADEX) 20 MG tablet Take 1 tablet (20 mg total) by mouth daily. 90 tablet 3  . Turmeric 500 MG CAPS Take 1,000 mg by mouth daily.    Marland Kitchen zinc gluconate 50 MG tablet Take 50 mg by mouth daily.    Marland Kitchen OVER THE COUNTER MEDICATION Take 450 mg by mouth daily. Tumeric     No current facility-administered medications for this visit.    Allergies as of 09/28/2020  . (No Known Allergies)    Family History  Problem Relation Age of Onset  . Colon cancer Neg Hx   . Colon polyps Neg Hx   . Esophageal cancer Neg Hx   . Rectal cancer Neg Hx   . Stomach cancer Neg Hx     Social History   Socioeconomic  History  . Marital status: Divorced    Spouse name: Not on file  . Number of children: Not on file  . Years of education: Not on file  . Highest education level: Not on file  Occupational History  . Not on file  Tobacco Use  . Smoking status: Never Smoker  . Smokeless tobacco: Never Used  Substance and Sexual Activity  . Alcohol use: Yes    Alcohol/week: 2.0 standard drinks    Types: 2 Glasses of wine per week    Comment: social  . Drug use: No  . Sexual activity: Yes    Birth control/protection: Post-menopausal, Surgical  Other Topics Concern  . Not on file  Social History Narrative  . Not on file   Social Determinants of Health   Financial Resource Strain: Not on file  Food Insecurity: Not on file  Transportation Needs: Not on file  Physical Activity: Not on file  Stress: Not on file  Social Connections: Not on file  Intimate Partner Violence: Not At Risk  . Fear of Current or Ex-Partner: No  . Emotionally Abused: No  . Physically Abused: No  . Sexually Abused: No     Physical Exam: BP 110/80   Pulse 76   Ht 5\' 3"  (1.6 m)   Wt 131 lb 9.6 oz (59.7 kg)   BMI 23.31 kg/m  Constitutional: generally well-appearing Psychiatric: alert and oriented x3 Eyes: extraocular movements intact Mouth: oral pharynx moist, no lesions Neck: supple no lymphadenopathy Cardiovascular: heart regular rate and rhythm Lungs: clear to auscultation bilaterally Abdomen: soft, nontender, nondistended, no obvious ascites, no peritoneal signs, normal bowel sounds Extremities: no lower extremity edema bilaterally Skin: no lesions on visible extremities   Assessment and plan: 70 y.o. female with personal history of adenomatous polyp, low risk  We had a very nice discussion about her subcentimeter adenoma.  Natural history, evolution of polyp surveillance guidelines over time.  She understands that proper surveillance interval would be 7 years from the time of her last colonoscopy.  She  has Apsley 0 GI symptoms which would warrant sooner evaluation and so we will put her recall in our system for December 2023.   Please see the "Patient Instructions" section for addition details about the plan.   Owens Loffler, MD [facility] 09/28/2020, 8:27 AM  Cc: Kathyrn Lass, MD  Total time on date of encounter was 40  minutes (this included time spent preparing to see the patient reviewing records; obtaining and/or reviewing separately obtained history; performing a medically appropriate exam and/or evaluation; counseling and educating the patient and family if present; ordering medications,  tests or procedures if applicable; and documenting clinical information in the health record).

## 2020-09-28 NOTE — Patient Instructions (Signed)
If you are age 70 or older, your body mass index should be between 23-30. Your Body mass index is 23.31 kg/m. If this is out of the aforementioned range listed, please consider follow up with your Primary Care Provider.  If you are age 70 or younger, your body mass index should be between 19-25. Your Body mass index is 23.31 kg/m. If this is out of the aformentioned range listed, please consider follow up with your Primary Care Provider.   You will be due for a colonoscopy in December 2023.  We will contact you to schedule colonoscopy.  Thank you for entrusting me with your care and choosing [facility].  Dr Ardis Hughs

## 2020-10-05 ENCOUNTER — Inpatient Hospital Stay: Payer: PPO

## 2020-10-05 ENCOUNTER — Inpatient Hospital Stay: Payer: PPO | Attending: Hematology and Oncology | Admitting: Genetic Counselor

## 2020-10-05 ENCOUNTER — Encounter: Payer: Self-pay | Admitting: Genetic Counselor

## 2020-10-05 ENCOUNTER — Other Ambulatory Visit: Payer: Self-pay

## 2020-10-05 DIAGNOSIS — D0512 Intraductal carcinoma in situ of left breast: Secondary | ICD-10-CM

## 2020-10-05 DIAGNOSIS — Z808 Family history of malignant neoplasm of other organs or systems: Secondary | ICD-10-CM | POA: Diagnosis not present

## 2020-10-05 LAB — GENETIC SCREENING ORDER

## 2020-10-05 NOTE — Progress Notes (Signed)
REFERRING PROVIDER: Nicholas Lose, MD [address] [place],  [place] [address]  PRIMARY PROVIDER:  Kathyrn Lass, MD  PRIMARY REASON FOR VISIT:  1. Ductal carcinoma in situ (DCIS) of left breast   2. Family history of melanoma      HISTORY OF PRESENT ILLNESS:   Janice [name], a 70 y.o. female, was seen for a [facility] cancer genetics consultation at the request of Dr. Lindi Adie due to a personal and family history of cancer.  Janice [name] presents to clinic today to discuss the possibility of a hereditary predisposition to cancer, genetic testing, and to further clarify her future cancer risks, as well as potential cancer risks for family members.   In March 2022, at the age of 70, Janice [name] was diagnosed with DCIS of the left breast. The treatment plan lumpectomy and radiation.     CANCER HISTORY:  Oncology History  Ductal carcinoma in situ (DCIS) of left breast  07/07/2020 Cancer Staging   Staging form: Breast, AJCC 8th Edition - Clinical stage from 07/07/2020: Stage 0 (cTis (DCIS), cN0, cM0, ER+, PR+) - Signed by Gardenia Phlegm, NP on 07/13/2020 Nuclear grade: G3   07/13/2020 Initial Diagnosis   Screening mammogram showed left breast calcifications. Diagnostic mammogram showed calcifications in the left breast spanning 2.3cm. Biopsy on 07/07/20 showed high grade DCIS, ER+ 100%, PR+ 70%.    07/26/2020 Surgery   Left lumpectomy Barry Dienes): high grade DCIS partially involving an intraductal papilloma, clear margins.   07/26/2020 Cancer Staging   Staging form: Breast, AJCC 8th Edition - Pathologic stage from 07/26/2020: Stage 0 (pTis (DCIS), pN0, cM0, ER+, PR+) - Signed by Gardenia Phlegm, NP on 08/10/2020 Stage prefix: Initial diagnosis   08/30/2020 - 09/23/2020 Radiation Therapy   Adjuvant radiation      RISK FACTORS:  Menarche was at age 89.  First live birth at age 41.  OCP use for approximately 0 years.  Ovaries intact: yes.  Hysterectomy: no.   Menopausal status: postmenopausal.  HRT use: 0 years. Colonoscopy: yes; normal. Mammogram within the last year: yes. Number of breast biopsies: 2. Up to date with pelvic exams: yes. Any excessive radiation exposure in the past: no  Past Medical History:  Diagnosis Date  . Cancer ([place]) 06/2020   left breast cancer  . Family history of melanoma   . Hypercholesterolemia     Past Surgical History:  Procedure Laterality Date  . BREAST BIOPSY Left    Pt unsure of date  over 15 years ago- [facility]   . BREAST EXCISIONAL BIOPSY Right    in her 20s  . breast lift Bilateral   . BREAST LUMPECTOMY Right    benign  . BREAST LUMPECTOMY WITH RADIOACTIVE SEED LOCALIZATION Left 07/26/2020   Procedure: LEFT BREAST LUMPECTOMY WITH BRACKETED RADIOACTIVE SEED LOCALIZATION;  Surgeon: Stark Klein, MD;  Location: [facility];  Service: General;  Laterality: Left;  RNFA; START TIME OF 11:45 AM FOR 60 MINUTES ROOM 5 BYERLY IQ  . BUNIONECTOMY Right   . lower body lift    . MOHS SURGERY     bridge of nose  . REDUCTION MAMMAPLASTY Bilateral   . TONSILLECTOMY AND ADENOIDECTOMY     age 71  . TUBAL LIGATION      Social History   Socioeconomic History  . Marital status: Divorced    Spouse name: Not on file  . Number of children: Not on file  . Years of education: Not on file  . Highest  education level: Not on file  Occupational History  . Not on file  Tobacco Use  . Smoking status: Never Smoker  . Smokeless tobacco: Never Used  Substance and Sexual Activity  . Alcohol use: Yes    Alcohol/week: 2.0 standard drinks    Types: 2 Glasses of wine per week    Comment: social  . Drug use: No  . Sexual activity: Yes    Birth control/protection: Post-menopausal, Surgical  Other Topics Concern  . Not on file  Social History Narrative  . Not on file   Social Determinants of Health   Financial Resource Strain: Not on file  Food Insecurity: Not on file  Transportation Needs: Not on  file  Physical Activity: Not on file  Stress: Not on file  Social Connections: Not on file     FAMILY HISTORY:  We obtained a detailed, 4-generation family history.  Significant diagnoses are listed below: Family History  Problem Relation Age of Onset  . Melanoma Mother        dx in 58s-70s  . Dementia Mother   . Dementia Father   . Melanoma Brother 106  . Congestive Heart Failure Maternal Grandmother   . AAA (abdominal aortic aneurysm) Maternal Grandfather   . Melanoma Paternal Grandfather 15  . Lung cancer Paternal Grandfather   . Colon cancer Neg Hx   . Colon polyps Neg Hx   . Esophageal cancer Neg Hx   . Rectal cancer Neg Hx   . Stomach cancer Neg Hx     The patient has two daughters who are cancer free.  She has a brother and sister.  Her brother was diagnosed with melanoma at 44. Reportedly he is a 'sun worshipper'.  Both parents are deceased.  The patient's mother had melanoma in her 67's-70's and had dementia at the end of her life.  She was an only child.  Her parents are deceased from non-cancer related issues.  The patient's father died at 47 from complications of dementia.  He is an only child.  His parents are deceased.  His father had melanoma at 38.  Janice [name] is unaware of previous family history of genetic testing for hereditary cancer risks. Patient's maternal ancestors are of [place] descent, and paternal ancestors are of [place] descent. There is no reported Ashkenazi Jewish ancestry. There is no known consanguinity.  GENETIC COUNSELING ASSESSMENT: Janice [name] is a 70 y.o. female with a personal and family history of cancer which is somewhat suggestive of a familial or sporadic predisposition to cancer given the ages of onset. We, therefore, discussed and recommended the following at today's visit.   DISCUSSION: We discussed that 5 - 10% of breast cancer is hereditary, with most cases associated with BRCA mutations.  There are other genes that  can be associated with hereditary breast cancer syndromes.  These include ATM, CHEK2 and PALB2.  We discussed that testing is beneficial for several reasons including knowing how to follow individuals after completing their treatment,  and understand if other family members could be at risk for cancer and allow them to undergo genetic testing.   We reviewed the characteristics, features and inheritance patterns of hereditary cancer syndromes. We also discussed genetic testing, including the appropriate family members to test, the process of testing, insurance coverage and turn-around-time for results. We discussed the implications of a negative, positive, carrier and/or variant of uncertain significant result. We recommended Janice [name] pursue genetic testing for the CancerNext-Expanded+RNAinsight gene panel. The CancerNext-Expanded  gene panel offered by [employer] and includes sequencing and rearrangement analysis for the following 77 genes: AIP, ALK, APC*, ATM*, AXIN2, BAP1, BARD1, BLM, BMPR1A, BRCA1*, BRCA2*, BRIP1*, CDC73, CDH1*, CDK4, CDKN1B, CDKN2A, CHEK2*, CTNNA1, DICER1, FANCC, FH, FLCN, GALNT12, KIF1B, LZTR1, MAX, MEN1, MET, MLH1*, MSH2*, MSH3, MSH6*, MUTYH*, NBN, NF1*, NF2, NTHL1, PALB2*, PHOX2B, PMS2*, POT1, PRKAR1A, PTCH1, PTEN*, RAD51C*, RAD51D*, RB1, RECQL, RET, SDHA, SDHAF2, SDHB, SDHC, SDHD, SMAD4, SMARCA4, SMARCB1, SMARCE1, STK11, SUFU, TMEM127, TP53*, TSC1, TSC2, VHL and XRCC2 (sequencing and deletion/duplication); EGFR, EGLN1, HOXB13, KIT, MITF, PDGFRA, POLD1, and POLE (sequencing only); EPCAM and GREM1 (deletion/duplication only). DNA and RNA analyses performed for * genes.   Based on Janice [name]'s personal and family history of cancer, she meets medical criteria for genetic testing. Despite that she meets criteria, she may still have an out of pocket cost. We discussed that if her out of pocket cost for testing is over $100, the laboratory will call and confirm whether she wants to  proceed with testing.  If the out of pocket cost of testing is less than $100 she will be billed by the genetic testing laboratory.   PLAN: After considering the risks, benefits, and limitations, Janice [name] provided informed consent to pursue genetic testing and the blood sample was sent to [facility] for analysis of the CancerNext-Expanded+RNAinsight. Results should be available within approximately 2-3 weeks' time, at which point they will be disclosed by telephone to Janice [name], as will any additional recommendations warranted by these results. Janice [name] will receive a summary of her genetic counseling visit and a copy of her results once available. This information will also be available in Epic.   Lastly, we encouraged Janice [name] to remain in contact with cancer genetics annually so that we can continuously update the family history and inform her of any changes in cancer genetics and testing that may be of benefit for this family.   Janice [name] questions were answered to her satisfaction today. Our contact information was provided should additional questions or concerns arise. Thank you for the referral and allowing [place] to share in the care of your patient.   Kyre Jeffries P. Fl[place]ne [place], [place], [facility] Licensed, Insurance risk surveyor [email or web address] phone: [phone]  The patient was seen for a total of 35-40 minutes in face-to-face genetic counseling.  The patient was seen alone.  This patient was discussed with Drs. Magrinat, Lindi Adie and/or Burr Medico who agrees with the above.    _______________________________________________________________________ For Office Staff:  Number of people involved in session: 1 Was an Intern/ student involved with case: no

## 2020-10-13 DIAGNOSIS — E7800 Pure hypercholesterolemia, unspecified: Secondary | ICD-10-CM | POA: Diagnosis not present

## 2020-10-24 ENCOUNTER — Ambulatory Visit: Payer: Self-pay | Admitting: Genetic Counselor

## 2020-10-24 ENCOUNTER — Encounter: Payer: Self-pay | Admitting: Genetic Counselor

## 2020-10-24 ENCOUNTER — Telehealth: Payer: Self-pay | Admitting: Genetic Counselor

## 2020-10-24 DIAGNOSIS — Z1379 Encounter for other screening for genetic and chromosomal anomalies: Secondary | ICD-10-CM | POA: Insufficient documentation

## 2020-10-24 DIAGNOSIS — D0512 Intraductal carcinoma in situ of left breast: Secondary | ICD-10-CM

## 2020-10-24 NOTE — Progress Notes (Signed)
HPI:  Ms. Mastandrea was previously seen in the [place] clinic due to a personal and family history of cancer and concerns regarding a hereditary predisposition to cancer. Please refer to our prior cancer genetics clinic note for more information regarding our discussion, assessment and recommendations, at the time. Ms. No recent genetic test results were disclosed to her, as were recommendations warranted by these results. These results and recommendations are discussed in more detail below.  CANCER HISTORY:  Oncology History  Ductal carcinoma in situ (DCIS) of left breast  07/07/2020 Cancer Staging   Staging form: Breast, AJCC 8th Edition - Clinical stage from 07/07/2020: Stage 0 (cTis (DCIS), cN0, cM0, ER+, PR+) - Signed by Gardenia Phlegm, NP on 07/13/2020  Nuclear grade: G3    07/13/2020 Initial Diagnosis   Screening mammogram showed left breast calcifications. Diagnostic mammogram showed calcifications in the left breast spanning 2.3cm. Biopsy on 07/07/20 showed high grade DCIS, ER+ 100%, PR+ 70%.    07/26/2020 Surgery   Left lumpectomy Barry Dienes): high grade DCIS partially involving an intraductal papilloma, clear margins.   07/26/2020 Cancer Staging   Staging form: Breast, AJCC 8th Edition - Pathologic stage from 07/26/2020: Stage 0 (pTis (DCIS), pN0, cM0, ER+, PR+) - Signed by Gardenia Phlegm, NP on 08/10/2020  Stage prefix: Initial diagnosis    08/30/2020 - 09/23/2020 Radiation Therapy   Adjuvant radiation   10/24/2020 Genetic Testing   Negative genetic testing on the CancerNext-Expanded+RNAinsight panel.  The CancerNext-Expanded gene panel offered by [facility] and includes sequencing and rearrangement analysis for the following 77 genes: AIP, ALK, APC*, ATM*, AXIN2, BAP1, BARD1, BLM, BMPR1A, BRCA1*, BRCA2*, BRIP1*, CDC73, CDH1*, CDK4, CDKN1B, CDKN2A, CHEK2*, CTNNA1, DICER1, FANCC, FH, FLCN, GALNT12, KIF1B, LZTR1, MAX, MEN1, MET, MLH1*, MSH2*,  MSH3, MSH6*, MUTYH*, NBN, NF1*, NF2, NTHL1, PALB2*, PHOX2B, PMS2*, POT1, PRKAR1A, PTCH1, PTEN*, RAD51C*, RAD51D*, RB1, RECQL, RET, SDHA, SDHAF2, SDHB, SDHC, SDHD, SMAD4, SMARCA4, SMARCB1, SMARCE1, STK11, SUFU, TMEM127, TP53*, TSC1, TSC2, VHL and XRCC2 (sequencing and deletion/duplication); EGFR, EGLN1, HOXB13, KIT, MITF, PDGFRA, POLD1, and POLE (sequencing only); EPCAM and GREM1 (deletion/duplication only). DNA and RNA analyses performed for * genes. The report date is October 24, 2020.     FAMILY HISTORY:  We obtained a detailed, 4-generation family history.  Significant diagnoses are listed below: Family History  Problem Relation Age of Onset   Melanoma Mother        dx in 45s-70s   Dementia Mother    Dementia Father    Melanoma Brother 33   Congestive Heart Failure Maternal Grandmother    AAA (abdominal aortic aneurysm) Maternal Grandfather    Melanoma Paternal Grandfather 23   Lung cancer Paternal Grandfather    Colon cancer Neg Hx    Colon polyps Neg Hx    Esophageal cancer Neg Hx    Rectal cancer Neg Hx    Stomach cancer Neg Hx     The patient has two daughters who are cancer free.  She has a brother and sister.  Her brother was diagnosed with melanoma at 27. Reportedly he is a 'sun worshipper'.  Both parents are deceased.   The patient's mother had melanoma in her 57's-70's and had dementia at the end of her life.  She was an only child.  Her parents are deceased from non-cancer related issues.   The patient's father died at 58 from complications of dementia.  He is an only child.  His parents are deceased.  His father had melanoma at  26.   Ms. Duvall is unaware of previous family history of genetic testing for hereditary cancer risks. Patient's maternal ancestors are of [place] descent, and paternal ancestors are of [place] descent. There is no reported Ashkenazi Jewish ancestry. There is no known consanguinity.  GENETIC TEST RESULTS: Genetic testing reported out  on October 24, 2020 through the CancerNext-Expanded+RNAinsight cancer panel found no pathogenic mutations. The CancerNext-Expanded gene panel offered by [facility] and includes sequencing and rearrangement analysis for the following 77 genes: AIP, ALK, APC*, ATM*, AXIN2, BAP1, BARD1, BLM, BMPR1A, BRCA1*, BRCA2*, BRIP1*, CDC73, CDH1*, CDK4, CDKN1B, CDKN2A, CHEK2*, CTNNA1, DICER1, FANCC, FH, FLCN, GALNT12, KIF1B, LZTR1, MAX, MEN1, MET, MLH1*, MSH2*, MSH3, MSH6*, MUTYH*, NBN, NF1*, NF2, NTHL1, PALB2*, PHOX2B, PMS2*, POT1, PRKAR1A, PTCH1, PTEN*, RAD51C*, RAD51D*, RB1, RECQL, RET, SDHA, SDHAF2, SDHB, SDHC, SDHD, SMAD4, SMARCA4, SMARCB1, SMARCE1, STK11, SUFU, TMEM127, TP53*, TSC1, TSC2, VHL and XRCC2 (sequencing and deletion/duplication); EGFR, EGLN1, HOXB13, KIT, MITF, PDGFRA, POLD1, and POLE (sequencing only); EPCAM and GREM1 (deletion/duplication only). DNA and RNA analyses performed for * genes. The test report has been scanned into EPIC and is located under the Molecular Pathology section of the Results Review tab.  A portion of the result report is included below for reference.     We discussed with Ms. Maulden that because current genetic testing is not perfect, it is possible there may be a gene mutation in one of these genes that current testing cannot detect, but that chance is small.  We also discussed, that there could be another gene that has not yet been discovered, or that we have not yet tested, that is responsible for the cancer diagnoses in the family. It is also possible there is a hereditary cause for the cancer in the family that Ms. Guillot did not inherit and therefore was not identified in her testing.  Therefore, it is important to remain in touch with cancer genetics in the future so that we can continue to offer Ms. Scerbo the most up to date genetic testing.   ADDITIONAL GENETIC TESTING: We discussed with Ms. Meske that her genetic testing was fairly extensive.  If there are  genes identified to increase cancer risk that can be analyzed in the future, we would be happy to discuss and coordinate this testing at that time.    CANCER SCREENING RECOMMENDATIONS: Ms. Ruddy test result is considered negative (normal).  This means that we have not identified a hereditary cause for her personal and family history of cancer at this time. Most cancers happen by chance and this negative test suggests that her cancer may fall into this category.    While reassuring, this does not definitively rule out a hereditary predisposition to cancer. It is still possible that there could be genetic mutations that are undetectable by current technology. There could be genetic mutations in genes that have not been tested or identified to increase cancer risk.  Therefore, it is recommended she continue to follow the cancer management and screening guidelines provided by her oncology and primary healthcare provider.   An individual's cancer risk and medical management are not determined by genetic test results alone. Overall cancer risk assessment incorporates additional factors, including personal medical history, family history, and any available genetic information that may result in a personalized plan for cancer prevention and surveillance  RECOMMENDATIONS FOR FAMILY MEMBERS:  Individuals in this family might be at some increased risk of developing cancer, over the general population risk, simply due to the family  history of cancer.  We recommended women in this family have a yearly mammogram beginning at age 48, or 22 years younger than the earliest onset of cancer, an annual clinical breast exam, and perform monthly breast self-exams. Women in this family should also have a gynecological exam as recommended by their primary provider. All family members should be referred for colonoscopy starting at age 50.  FOLLOW-UP: Lastly, we discussed with Ms. Rieger that cancer genetics is a rapidly  advancing field and it is possible that new genetic tests will be appropriate for her and/or her family members in the future. We encouraged her to remain in contact with cancer genetics on an annual basis so we can update her personal and family histories and let her know of advances in cancer genetics that may benefit this family.   Our contact number was provided. Ms. Schifano questions were answered to her satisfaction, and she knows she is welcome to call us at anytime with additional questions or concerns.   Roma Kayser, [place], [facility] Licensed, Certified Genetic Counselor [email or web address]

## 2020-10-24 NOTE — Telephone Encounter (Signed)
Revealed negative genetic testing.  Discussed that we do not know why she has DCIS breast cancer or why there is cancer in the family. It could be due to a different gene that we are not testing, or maybe our current technology may not be able to pick something up.  It will be important for her to keep in contact with genetics to keep up with whether additional testing may be needed.

## 2020-11-08 DIAGNOSIS — D0512 Intraductal carcinoma in situ of left breast: Secondary | ICD-10-CM | POA: Diagnosis not present

## 2020-11-08 DIAGNOSIS — Z01419 Encounter for gynecological examination (general) (routine) without abnormal findings: Secondary | ICD-10-CM | POA: Diagnosis not present

## 2020-11-08 DIAGNOSIS — Z124 Encounter for screening for malignant neoplasm of cervix: Secondary | ICD-10-CM | POA: Diagnosis not present

## 2020-11-22 ENCOUNTER — Telehealth: Payer: Self-pay | Admitting: Adult Health

## 2020-11-22 NOTE — Telephone Encounter (Signed)
Rescheduled appointment per provider. Patient is aware. 

## 2020-12-21 ENCOUNTER — Encounter: Payer: PPO | Admitting: Adult Health

## 2020-12-22 ENCOUNTER — Encounter: Payer: Self-pay | Admitting: Adult Health

## 2020-12-22 ENCOUNTER — Inpatient Hospital Stay: Payer: PPO | Attending: Adult Health | Admitting: Adult Health

## 2020-12-22 ENCOUNTER — Other Ambulatory Visit: Payer: Self-pay

## 2020-12-22 VITALS — BP 128/76 | HR 72 | Temp 97.7°F | Resp 18 | Ht 63.0 in | Wt 132.1 lb

## 2020-12-22 DIAGNOSIS — Z923 Personal history of irradiation: Secondary | ICD-10-CM | POA: Insufficient documentation

## 2020-12-22 DIAGNOSIS — D0512 Intraductal carcinoma in situ of left breast: Secondary | ICD-10-CM | POA: Diagnosis not present

## 2020-12-22 DIAGNOSIS — E7800 Pure hypercholesterolemia, unspecified: Secondary | ICD-10-CM | POA: Insufficient documentation

## 2020-12-22 DIAGNOSIS — Z79899 Other long term (current) drug therapy: Secondary | ICD-10-CM | POA: Insufficient documentation

## 2020-12-22 DIAGNOSIS — Z79810 Long term (current) use of selective estrogen receptor modulators (SERMs): Secondary | ICD-10-CM | POA: Insufficient documentation

## 2020-12-22 NOTE — Progress Notes (Signed)
SURVIVORSHIP VISIT:   BRIEF ONCOLOGIC HISTORY:  Oncology History  Ductal carcinoma in situ (DCIS) of left breast  07/07/2020 Cancer Staging   Staging form: Breast, AJCC 8th Edition - Clinical stage from 07/07/2020: Stage 0 (cTis (DCIS), cN0, cM0, ER+, PR+) - Signed by Gardenia Phlegm, NP on 07/13/2020 Nuclear grade: G3   07/13/2020 Initial Diagnosis   Screening mammogram showed left breast calcifications. Diagnostic mammogram showed calcifications in the left breast spanning 2.3cm. Biopsy on 07/07/20 showed high grade DCIS, ER+ 100%, PR+ 70%.    07/26/2020 Surgery   Left lumpectomy Barry Dienes): high grade DCIS partially involving an intraductal papilloma, clear margins.   07/26/2020 Cancer Staging   Staging form: Breast, AJCC 8th Edition - Pathologic stage from 07/26/2020: Stage 0 (pTis (DCIS), pN0, cM0, ER+, PR+) - Signed by Gardenia Phlegm, NP on 08/10/2020 Stage prefix: Initial diagnosis   08/29/2020 - 09/23/2020 Radiation Therapy   Adjuvant radiation Site Technique Total Dose (Gy) Dose per Fx (Gy) Completed Fx Beam Energies  Breast, Left: Breast_Lt 3D 42.56/42.56 2.66 16/16 6X  Breast, Left: Breast_Lt_Bst 3D 10/10 2.5 4/4 6X    10/24/2020 Genetic Testing   Negative genetic testing on the CancerNext-Expanded+RNAinsight panel.  The CancerNext-Expanded gene panel offered by [facility] and includes sequencing and rearrangement analysis for the following 77 genes: AIP, ALK, APC*, ATM*, AXIN2, BAP1, BARD1, BLM, BMPR1A, BRCA1*, BRCA2*, BRIP1*, CDC73, CDH1*, CDK4, CDKN1B, CDKN2A, CHEK2*, CTNNA1, DICER1, FANCC, FH, FLCN, GALNT12, KIF1B, LZTR1, MAX, MEN1, MET, MLH1*, MSH2*, MSH3, MSH6*, MUTYH*, NBN, NF1*, NF2, NTHL1, PALB2*, PHOX2B, PMS2*, POT1, PRKAR1A, PTCH1, PTEN*, RAD51C*, RAD51D*, RB1, RECQL, RET, SDHA, SDHAF2, SDHB, SDHC, SDHD, SMAD4, SMARCA4, SMARCB1, SMARCE1, STK11, SUFU, TMEM127, TP53*, TSC1, TSC2, VHL and XRCC2 (sequencing and deletion/duplication); EGFR, EGLN1, HOXB13, KIT, MITF,  PDGFRA, POLD1, and POLE (sequencing only); EPCAM and GREM1 (deletion/duplication only). DNA and RNA analyses performed for * genes. The report date is October 24, 2020.   10/2020 -  Anti-estrogen oral therapy   Tamoxifen daily     INTERVAL HISTORY:  Ms. Walthour to review her survivorship care plan detailing her treatment course for breast cancer, as well as monitoring long-term side effects of that treatment, education regarding health maintenance, screening, and overall wellness and health promotion.     Overall, Ms. Seifer reports feeling quite well.  She is taking tamoxifen daily and is tolerating it quite well.   She is feeling well, exercising, and healing well.  She has no concerns today.    REVIEW OF SYSTEMS:  Review of Systems  Constitutional:  Negative for appetite change, chills, fatigue, fever and unexpected weight change.  HENT:   Negative for hearing loss, lump/mass and trouble swallowing.   Eyes:  Negative for eye problems and icterus.  Respiratory:  Negative for chest tightness, cough and shortness of breath.   Cardiovascular:  Negative for chest pain, leg swelling and palpitations.  Gastrointestinal:  Negative for abdominal distention, abdominal pain, constipation, diarrhea, nausea and vomiting.  Endocrine: Negative for hot flashes.  Genitourinary:  Negative for difficulty urinating.   Musculoskeletal:  Negative for arthralgias.  Skin:  Negative for itching and rash.  Neurological:  Negative for dizziness, extremity weakness, headaches and numbness.  Hematological:  Negative for adenopathy. Does not bruise/bleed easily.  Psychiatric/Behavioral:  Negative for depression. The patient is not nervous/anxious.   Breast: Denies any new nodularity, masses, tenderness, nipple changes, or nipple discharge.   ONCOLOGY TREATMENT TEAM:  1. Surgeon:  Dr. Barry Dienes at [facility] 2. Medical Oncologist: Dr.  Gudena  3. Radiation Oncologist: Dr. Lisbeth Renshaw    PAST  MEDICAL/SURGICAL HISTORY:  Past Medical History:  Diagnosis Date   Cancer [facility]) 06/2020   left breast cancer   Family history of melanoma    Hypercholesterolemia    Past Surgical History:  Procedure Laterality Date   BREAST BIOPSY Left    Pt unsure of date  over 15 years ago- [facility]    BREAST EXCISIONAL BIOPSY Right    in her 15s   breast lift Bilateral    BREAST LUMPECTOMY Right    benign   BREAST LUMPECTOMY WITH RADIOACTIVE SEED LOCALIZATION Left 07/26/2020   Procedure: LEFT BREAST LUMPECTOMY WITH BRACKETED RADIOACTIVE SEED LOCALIZATION;  Surgeon: Stark Klein, MD;  Location: [facility];  Service: General;  Laterality: Left;  RNFA; START TIME OF 11:45 AM FOR 60 MINUTES ROOM 5 BYERLY IQ   BUNIONECTOMY Right    lower body lift     MOHS SURGERY     bridge of nose   REDUCTION MAMMAPLASTY Bilateral    TONSILLECTOMY AND ADENOIDECTOMY     age 70   TUBAL LIGATION       ALLERGIES:  No Known Allergies   CURRENT MEDICATIONS:  Outpatient Encounter Medications as of 12/22/2020  Medication Sig   Apoaequorin (PREVAGEN EXTRA STRENGTH) 20 MG CAPS See admin instructions.   b complex vitamins tablet Take 1 tablet by mouth daily.   calcium-vitamin D (OSCAL WITH D) 500-200 MG-UNIT tablet Take 1 tablet by mouth.   Coenzyme Q10 300 MG CAPS Take by mouth.   Evening Primrose Oil 500 MG CAPS Take 1 capsule by mouth daily.   Glucos-MSM-C-Mn-Ginger-Willow (GLUCOSAMINE MSM COMPLEX PO) Take 1,500 mg by mouth daily.   Linoleic Acid Conjugated 1000 MG CAPS Take 4 capsules (4,000 mg total) by mouth daily.   magnesium oxide (MAG-OX) 400 MG tablet Take 400 mg by mouth daily.   Multiple Vitamin (MULTIVITAMIN) tablet Take 1 tablet by mouth daily.   Multiple Vitamins-Minerals (HAIR SKIN AND NAILS FORMULA) TABS See admin instructions.   Nutritional Supplements (ESTROVEN PO) Take by mouth daily.   Probiotic Product (PROBIOTIC DAILY PO) Take by mouth daily.   S-Adenosylmethionine (SAM-E) 400 MG  TABS Take 1 tablet (400 mg total) by mouth in the morning and at bedtime.   Spirulina 500 MG TABS Take 500 mg by mouth daily.   tamoxifen (NOLVADEX) 20 MG tablet Take 1 tablet (20 mg total) by mouth daily.   Turmeric 500 MG CAPS Take 1,000 mg by mouth daily.   zinc gluconate 50 MG tablet Take 50 mg by mouth daily.   [DISCONTINUED] OVER THE COUNTER MEDICATION Take 450 mg by mouth daily. Tumeric   No facility-administered encounter medications on file as of 12/22/2020.     ONCOLOGIC FAMILY HISTORY:  Family History  Problem Relation Age of Onset   Melanoma Mother        dx in 20s-70s   Dementia Mother    Dementia Father    Melanoma Brother 64   Congestive Heart Failure Maternal Grandmother    AAA (abdominal aortic aneurysm) Maternal Grandfather    Melanoma Paternal Grandfather 95   Lung cancer Paternal Grandfather    Colon cancer Neg Hx    Colon polyps Neg Hx    Esophageal cancer Neg Hx    Rectal cancer Neg Hx    Stomach cancer Neg Hx       SOCIAL HISTORY:  Social History   Socioeconomic History   Marital status: Divorced  Spouse name: Not on file   Number of children: Not on file   Years of education: Not on file   Highest education level: Not on file  Occupational History   Not on file  Tobacco Use   Smoking status: Never   Smokeless tobacco: Never  Substance and Sexual Activity   Alcohol use: Yes    Alcohol/week: 2.0 standard drinks    Types: 2 Glasses of wine per week    Comment: social   Drug use: No   Sexual activity: Yes    Birth control/protection: Post-menopausal, Surgical  Other Topics Concern   Not on file  Social History Narrative   Not on file   Social Determinants of Health   Financial Resource Strain: Not on file  Food Insecurity: Not on file  Transportation Needs: Not on file  Physical Activity: Not on file  Stress: Not on file  Social Connections: Not on file  Intimate Partner Violence: Not At Risk   Fear of Current or Ex-Partner: No    Emotionally Abused: No   Physically Abused: No   Sexually Abused: No     OBSERVATIONS/OBJECTIVE:  BP 128/76 (BP Location: Left Arm, Patient Position: Sitting)   Pulse 72   Temp 97.7 F (36.5 C) (Tympanic)   Resp 18   Ht _0  (1.6 m)   Wt 132 lb 1.6 oz (59.9 kg)   SpO2 100%   BMI 23.40 kg/m  GENERAL: Patient is a well appearing female in no acute distress HEENT:  Sclerae anicteric.  Oropharynx clear and moist. No ulcerations or evidence of oropharyngeal candidiasis. Neck is supple.  NODES:  No cervical, supraclavicular, or axillary lymphadenopathy palpated.  BREAST EXAM:  left breast s/p lumpectomy and radiation, no sign of local recurrence, right breast benign LUNGS:  Clear to auscultation bilaterally.  No wheezes or rhonchi. HEART:  Regular rate and rhythm. No murmur appreciated. ABDOMEN:  Soft, nontender.  Positive, normoactive bowel sounds. No organomegaly palpated. MSK:  No focal spinal tenderness to palpation. Full range of motion bilaterally in the upper extremities. EXTREMITIES:  No peripheral edema.   SKIN:  Clear with no obvious rashes or skin changes. No nail dyscrasia. NEURO:  Nonfocal. Well oriented.  Appropriate affect.   LABORATORY DATA:  None for this visit.  DIAGNOSTIC IMAGING:  None for this visit.      ASSESSMENT AND PLAN:  Ms.. Parlow is a pleasant 70 y.o. female with Stage 0 left breast DCIS, ER+/PR+, diagnosed in 07/2020, treated with lumpectomy, adjuvant radiation therapy, and anti-estrogen therapy with Tamoxifen beginning in 10/2020.  She presents to the Survivorship Clinic for our initial meeting and routine follow-up post-completion of treatment for breast cancer.    1. Stage 0 left breast cancer:  Ms. Mchan is continuing to recover from definitive treatment for breast cancer. She will follow-up with her medical oncologist, Dr. Lindi Adie in 6 months with history and physical exam per surveillance protocol.  She will continue her anti-estrogen  therapy with Tamoxifen. Thus far, she is tolerating the Tamoxifen well, with minimal side effects. She was instructed to make Dr. Lindi Adie or myself aware if she begins to experience any worsening side effects of the medication and I could see her back in clinic to help manage those side effects, as needed. Her mammogram is due 06/2021; orders placed today.  Today, a comprehensive survivorship care plan and treatment summary was reviewed with the patient today detailing her breast cancer diagnosis, treatment course, potential late/long-term effects of treatment, appropriate  follow-up care with recommendations for the future, and patient education resources.  A copy of this summary, along with a letter will be sent to the patient's primary care provider via mail/fax/In Basket message after today's visit.    2. Bone health:  She was given education on specific activities to promote bone health.  3. Cancer screening:  Due to Ms. Osinski's history and her age, she should receive screening for skin cancers, colon cancer, and gynecologic cancers.  The information and recommendations are listed on the patient's comprehensive care plan/treatment summary and were reviewed in detail with the patient.    4. Health maintenance and wellness promotion: Ms. Stannard was encouraged to consume 5-7 servings of fruits and vegetables per day. We reviewed the "Nutrition Rainbow" handout, as well as the handout "Take Control of Your Health and Reduce Your Cancer Risk" from the [place].  She was also encouraged to engage in moderate to vigorous exercise for 30 minutes per day most days of the week. We discussed the [employer] fitness program, which is designed for cancer survivors to help them become more physically fit after cancer treatments.  She was instructed to limit her alcohol consumption and continue to abstain from tobacco use.     5. Support services/counseling: It is not uncommon for this period of  the patient's cancer care trajectory to be one of many emotions and stressors.  We discussed how this can be increasingly difficult during the times of quarantine and social distancing due to the COVID-19 pandemic.   She was given information regarding our available services and encouraged to contact me with any questions or for help enrolling in any of our support group/programs.    Follow up instructions:    -Return to cancer center in 6 months for f/u with Dr. Lindi Adie  -Mammogram due in 06/2021 -Follow up with surgery in one year -She is welcome to return back to the Survivorship Clinic at any time; no additional follow-up needed at this time.  -Consider referral back to survivorship as a long-term survivor for continued surveillance  The patient was provided an opportunity to ask questions and all were answered. The patient agreed with the plan and demonstrated an understanding of the instructions.   Total encounter time: 30 minutes in face to face visit time, chart review, lab review, order entry, care coordination, and documentation of encounter.   Wilber Bihari, NP 12/23/20 7:21 AM Medical Oncology and Hematology [facility] [place], [place] [address] Tel. [phone]    Fax. [phone]  *Total Encounter Time as defined by the Centers for Medicare and Medicaid Services includes, in addition to the face-to-face time of a patient visit (documented in the note above) non-face-to-face time: obtaining and reviewing outside history, ordering and reviewing medications, tests or procedures, care coordination (communications with other health care professionals or caregivers) and documentation in the medical record.

## 2021-06-19 ENCOUNTER — Ambulatory Visit
Admission: RE | Admit: 2021-06-19 | Discharge: 2021-06-19 | Disposition: A | Discharge status: Home / Self Care | Payer: PPO | Source: Ambulatory Visit | Attending: Adult Health | Admitting: Adult Health

## 2021-06-19 DIAGNOSIS — Z853 Personal history of malignant neoplasm of breast: Secondary | ICD-10-CM | POA: Diagnosis not present

## 2021-06-19 DIAGNOSIS — D0512 Intraductal carcinoma in situ of left breast: Secondary | ICD-10-CM

## 2021-06-19 DIAGNOSIS — R922 Inconclusive mammogram: Secondary | ICD-10-CM | POA: Diagnosis not present

## 2021-06-19 HISTORY — DX: Personal history of irradiation: Z92.3

## 2021-06-24 NOTE — Progress Notes (Signed)
Patient Care Team: Kathyrn Lass, MD as PCP - General (Family Medicine) Kyung Rudd, MD as Consulting Physician (Radiation Oncology) Stark Klein, MD as Consulting Physician (General Surgery) Nicholas Lose, MD as Consulting Physician (Hematology and Oncology)  DIAGNOSIS:    ICD-10-CM   1. Ductal carcinoma in situ (DCIS) of left breast  D05.12       SUMMARY OF ONCOLOGIC HISTORY: Oncology History  Ductal carcinoma in situ (DCIS) of left breast  07/07/2020 Cancer Staging   Staging form: Breast, AJCC 8th Edition - Clinical stage from 07/07/2020: Stage 0 (cTis (DCIS), cN0, cM0, ER+, PR+) - Signed by Gardenia Phlegm, NP on 07/13/2020 Nuclear grade: G3    07/13/2020 Initial Diagnosis   Screening mammogram showed left breast calcifications. Diagnostic mammogram showed calcifications in the left breast spanning 2.3cm. Biopsy on 07/07/20 showed high grade DCIS, ER+ 100%, PR+ 70%.    07/26/2020 Surgery   Left lumpectomy Barry Dienes): high grade DCIS partially involving an intraductal papilloma, clear margins.   07/26/2020 Cancer Staging   Staging form: Breast, AJCC 8th Edition - Pathologic stage from 07/26/2020: Stage 0 (pTis (DCIS), pN0, cM0, ER+, PR+) - Signed by Gardenia Phlegm, NP on 08/10/2020 Stage prefix: Initial diagnosis    08/29/2020 - 09/23/2020 Radiation Therapy   Adjuvant radiation Site Technique Total Dose (Gy) Dose per Fx (Gy) Completed Fx Beam Energies  Breast, Left: Breast_Lt 3D 42.56/42.56 2.66 16/16 6X  Breast, Left: Breast_Lt_Bst 3D 10/10 2.5 4/4 6X    10/24/2020 Genetic Testing   Negative genetic testing on the CancerNext-Expanded+RNAinsight panel.  The CancerNext-Expanded gene panel offered by [facility] and includes sequencing and rearrangement analysis for the following 77 genes: AIP, ALK, APC*, ATM*, AXIN2, BAP1, BARD1, BLM, BMPR1A, BRCA1*, BRCA2*, BRIP1*, CDC73, CDH1*, CDK4, CDKN1B, CDKN2A, CHEK2*, CTNNA1, DICER1, FANCC, FH, FLCN, GALNT12, KIF1B, LZTR1, MAX,  MEN1, MET, MLH1*, MSH2*, MSH3, MSH6*, MUTYH*, NBN, NF1*, NF2, NTHL1, PALB2*, PHOX2B, PMS2*, POT1, PRKAR1A, PTCH1, PTEN*, RAD51C*, RAD51D*, RB1, RECQL, RET, SDHA, SDHAF2, SDHB, SDHC, SDHD, SMAD4, SMARCA4, SMARCB1, SMARCE1, STK11, SUFU, TMEM127, TP53*, TSC1, TSC2, VHL and XRCC2 (sequencing and deletion/duplication); EGFR, EGLN1, HOXB13, KIT, MITF, PDGFRA, POLD1, and POLE (sequencing only); EPCAM and GREM1 (deletion/duplication only). DNA and RNA analyses performed for * genes. The report date is October 24, 2020.   10/2020 -  Anti-estrogen oral therapy   Tamoxifen daily     CHIEF COMPLIANT: Follow-up of left breast DCIS  INTERVAL HISTORY: Janice [name] is a 71 y.o. with above-mentioned history of left breast DCIS who underwent a left lumpectomy and radiation. Mammogram on 06/19/2020 showed no evidence of malignancy. She presents to the clinic today for follow-up.  She is tolerating tamoxifen extremely well without any problems.  She does notice more anxiety and nervousness especially when she travels.  ALLERGIES:  has No Known Allergies.  MEDICATIONS:  Current Outpatient Medications  Medication Sig Dispense Refill   Apoaequorin (PREVAGEN EXTRA STRENGTH) 20 MG CAPS See admin instructions.     b complex vitamins tablet Take 1 tablet by mouth daily.     calcium-vitamin D (OSCAL WITH D) 500-200 MG-UNIT tablet Take 1 tablet by mouth.     Coenzyme Q10 300 MG CAPS Take by mouth.     Evening Primrose Oil 500 MG CAPS Take 1 capsule by mouth daily.     Glucos-MSM-C-Mn-Ginger-Willow (GLUCOSAMINE MSM COMPLEX PO) Take 1,500 mg by mouth daily.     Linoleic Acid Conjugated 1000 MG CAPS Take 4 capsules (4,000 mg total) by mouth daily.  magnesium oxide (MAG-OX) 400 MG tablet Take 400 mg by mouth daily.     Multiple Vitamin (MULTIVITAMIN) tablet Take 1 tablet by mouth daily.     Multiple Vitamins-Minerals (HAIR SKIN AND NAILS FORMULA) TABS See admin instructions.     Nutritional Supplements (ESTROVEN  PO) Take by mouth daily.     Probiotic Product (PROBIOTIC DAILY PO) Take by mouth daily.     S-Adenosylmethionine (SAM-E) 400 MG TABS Take 1 tablet (400 mg total) by mouth in the morning and at bedtime.     Spirulina 500 MG TABS Take 500 mg by mouth daily.     tamoxifen (NOLVADEX) 20 MG tablet Take 1 tablet (20 mg total) by mouth daily. 90 tablet 3   Turmeric 500 MG CAPS Take 1,000 mg by mouth daily.     zinc gluconate 50 MG tablet Take 50 mg by mouth daily.     No current facility-administered medications for this visit.    PHYSICAL EXAMINATION: ECOG PERFORMANCE STATUS: 1 - Symptomatic but completely ambulatory  Vitals:   06/26/21 0926  BP: 127/76  Pulse: 88  Resp: 18  Temp: 97.7 F (36.5 C)  SpO2: 99%   Filed Weights   06/26/21 0926  Weight: 131 lb 1.6 oz (59.5 kg)    BREAST: No palpable masses or nodules in either right or left breasts. No palpable axillary supraclavicular or infraclavicular adenopathy no breast tenderness or nipple discharge. (exam performed in the presence of a chaperone)    ASSESSMENT & PLAN:  Ductal carcinoma in situ (DCIS) of left breast 07/13/2020:Screening mammogram showed left breast calcifications. Diagnostic mammogram showed calcifications in the left breast spanning 2.3cm. Biopsy on 07/07/20 showed high grade DCIS, ER+ 100%, PR+ 70%.  07/26/2020:Left lumpectomy Barry Dienes): high grade DCIS partially involving an intraductal papilloma, clear margins.  ER 100%, PR 70% Adjuvant radiation: 08/30/2020 to 09/23/2020   Treatment plan: Adjuvant antiestrogen therapy with tamoxifen 20 mg once daily x5 years started 10/05/2020 Tamoxifen toxicities: 1.  Hot flashes: She had this prior to starting tamoxifen.  She takes [place] which contains black cohosh. 2. severe anxiety intermittently: I gave her a prescription for Xanax to be taken as needed.  Especially when she travels she needs the antianxiety medication.  She walks 4 to 6 miles a day.  Breast cancer  surveillance: 1.  Breast exam 06/26/2021: Benign 2. mammogram 06/19/2021: Benign breast density category B  Return to clinic in 1 year for follow-up    No orders of the defined types were placed in this encounter.  The patient has a good understanding of the overall plan. she agrees with it. she will call with any problems that may develop before the next visit here.  Total time spent: 20 mins including face to face time and time spent for planning, charting and coordination of care  Rulon Eisenmenger, MD, MPH 06/26/2021  I, Thana Ates, am acting as scribe for Dr. Nicholas Lose.  I have reviewed the above documentation for accuracy and completeness, and I agree with the above.

## 2021-06-26 ENCOUNTER — Inpatient Hospital Stay: Payer: PPO | Attending: Hematology and Oncology | Admitting: Hematology and Oncology

## 2021-06-26 ENCOUNTER — Other Ambulatory Visit: Payer: Self-pay

## 2021-06-26 DIAGNOSIS — F419 Anxiety disorder, unspecified: Secondary | ICD-10-CM | POA: Diagnosis not present

## 2021-06-26 DIAGNOSIS — D0512 Intraductal carcinoma in situ of left breast: Secondary | ICD-10-CM | POA: Diagnosis not present

## 2021-06-26 DIAGNOSIS — N951 Menopausal and female climacteric states: Secondary | ICD-10-CM | POA: Diagnosis not present

## 2021-06-26 MED ORDER — BENEFIBER PO POWD
1.0000 | Freq: Two times a day (BID) | ORAL | 0 refills | Status: AC
Start: 2021-06-26 — End: ?

## 2021-06-26 MED ORDER — CITRUS BERGAMOT 650 MG PO TABS
2.0000 | ORAL_TABLET | Freq: Every day | ORAL | Status: DC
Start: 2021-06-26 — End: 2023-07-15

## 2021-06-26 MED ORDER — ALPRAZOLAM 0.25 MG PO TABS: 0.2500 mg | ORAL_TABLET | Freq: Every evening | ORAL | 1 refills | Status: DC | PRN

## 2021-06-26 NOTE — Assessment & Plan Note (Signed)
07/13/2020:Screening mammogram showed left breast calcifications. Diagnostic mammogram showed calcifications in the left breast spanning 2.3cm. Biopsy on 07/07/20 showed high grade DCIS, ER+ 100%, PR+ 70%. 07/26/2020:Left lumpectomy Barry Dienes): high grade DCIS partially involving an intraductal papilloma, clear margins.ER 100%, PR 70% Adjuvant radiation: 08/30/2020 to 09/23/2020  Treatment plan: Adjuvant antiestrogen therapy with tamoxifen 20 mg once daily x5 years started 10/05/2020 Tamoxifen toxicities: 1.  Hot flashes: She had this prior to starting tamoxifen.  She takes [place] which contains black cohosh.  She walks 4 to 6 miles a day.  Breast cancer surveillance: 1.  Breast exam 06/26/2021: Benign 2. mammogram 06/19/2021: Benign breast density category B  Return to clinic in 1 year for follow-up

## 2021-06-27 ENCOUNTER — Telehealth: Payer: Self-pay | Admitting: Hematology and Oncology

## 2021-06-27 NOTE — Telephone Encounter (Signed)
Scheduled appointment per 2/20 los. Patient is aware.

## 2021-07-05 DIAGNOSIS — W010XXA Fall on same level from slipping, tripping and stumbling without subsequent striking against object, initial encounter: Secondary | ICD-10-CM | POA: Diagnosis not present

## 2021-07-05 DIAGNOSIS — S0993XA Unspecified injury of face, initial encounter: Secondary | ICD-10-CM | POA: Diagnosis not present

## 2021-09-07 ENCOUNTER — Other Ambulatory Visit: Payer: Self-pay | Admitting: Hematology and Oncology

## 2021-09-18 DIAGNOSIS — M8580 Other specified disorders of bone density and structure, unspecified site: Secondary | ICD-10-CM | POA: Diagnosis not present

## 2021-09-18 DIAGNOSIS — Z0000 Encounter for general adult medical examination without abnormal findings: Secondary | ICD-10-CM | POA: Diagnosis not present

## 2021-09-18 DIAGNOSIS — I2510 Atherosclerotic heart disease of native coronary artery without angina pectoris: Secondary | ICD-10-CM | POA: Diagnosis not present

## 2021-09-18 DIAGNOSIS — Z6823 Body mass index (BMI) 23.0-23.9, adult: Secondary | ICD-10-CM | POA: Diagnosis not present

## 2021-09-18 DIAGNOSIS — E559 Vitamin D deficiency, unspecified: Secondary | ICD-10-CM | POA: Diagnosis not present

## 2021-09-18 DIAGNOSIS — D0512 Intraductal carcinoma in situ of left breast: Secondary | ICD-10-CM | POA: Diagnosis not present

## 2021-09-18 DIAGNOSIS — I700 Atherosclerosis of aorta: Secondary | ICD-10-CM | POA: Diagnosis not present

## 2021-09-20 ENCOUNTER — Encounter (HOSPITAL_COMMUNITY): Payer: Self-pay

## 2021-10-03 DIAGNOSIS — L570 Actinic keratosis: Secondary | ICD-10-CM | POA: Diagnosis not present

## 2021-10-03 DIAGNOSIS — L814 Other melanin hyperpigmentation: Secondary | ICD-10-CM | POA: Diagnosis not present

## 2021-10-03 DIAGNOSIS — L578 Other skin changes due to chronic exposure to nonionizing radiation: Secondary | ICD-10-CM | POA: Diagnosis not present

## 2021-10-03 DIAGNOSIS — D1801 Hemangioma of skin and subcutaneous tissue: Secondary | ICD-10-CM | POA: Diagnosis not present

## 2021-10-03 DIAGNOSIS — L821 Other seborrheic keratosis: Secondary | ICD-10-CM | POA: Diagnosis not present

## 2021-10-03 DIAGNOSIS — D229 Melanocytic nevi, unspecified: Secondary | ICD-10-CM | POA: Diagnosis not present

## 2021-11-08 ENCOUNTER — Other Ambulatory Visit: Payer: Self-pay | Admitting: Hematology and Oncology

## 2021-11-08 DIAGNOSIS — Z853 Personal history of malignant neoplasm of breast: Secondary | ICD-10-CM | POA: Diagnosis not present

## 2021-11-08 DIAGNOSIS — Z9189 Other specified personal risk factors, not elsewhere classified: Secondary | ICD-10-CM | POA: Diagnosis not present

## 2021-12-22 DIAGNOSIS — C50412 Malignant neoplasm of upper-outer quadrant of left female breast: Secondary | ICD-10-CM | POA: Diagnosis not present

## 2021-12-22 DIAGNOSIS — Z170 Estrogen receptor positive status [ER+]: Secondary | ICD-10-CM | POA: Diagnosis not present

## 2022-02-23 ENCOUNTER — Encounter: Payer: Self-pay | Admitting: Gastroenterology

## 2022-03-01 ENCOUNTER — Encounter: Payer: Self-pay | Admitting: Gastroenterology

## 2022-03-31 ENCOUNTER — Other Ambulatory Visit: Payer: Self-pay | Admitting: Hematology and Oncology

## 2022-04-06 ENCOUNTER — Ambulatory Visit (AMBULATORY_SURGERY_CENTER): Payer: PPO | Admitting: *Deleted

## 2022-04-06 VITALS — Ht 63.0 in | Wt 133.0 lb

## 2022-04-06 DIAGNOSIS — Z86010 Personal history of colonic polyps: Secondary | ICD-10-CM

## 2022-04-06 MED ORDER — NA SULFATE-K SULFATE-MG SULF 17.5-3.13-1.6 GM/177ML PO SOLN: 1.0000 | Freq: Once | ORAL | 0 refills | Status: AC

## 2022-04-06 NOTE — Progress Notes (Signed)
No egg or soy allergy known to patient  No issues known to pt with past sedation with any surgeries or procedures Patient denies ever being told they had issues or difficulty with intubation  No FH of Malignant Hyperthermia Pt is not on diet pills Pt is not on  home 02  Pt is not on blood thinners  Pt denies issues with constipation  Pt encouraged to use to use Singlecare or Goodrx to reduce cost  In person visit Coupon given Patient's chart reviewed by Osvaldo Angst CNRA prior to previsit and patient appropriate for the [place].  Previsit completed and red dot placed by patient's name on their procedure day (on provider's schedule).  Marland Kitchen

## 2022-05-02 ENCOUNTER — Encounter: Payer: Self-pay | Admitting: Gastroenterology

## 2022-05-04 ENCOUNTER — Ambulatory Visit (AMBULATORY_SURGERY_CENTER): Payer: PPO | Admitting: Gastroenterology

## 2022-05-04 ENCOUNTER — Encounter: Payer: Self-pay | Admitting: Gastroenterology

## 2022-05-04 VITALS — BP 121/70 | HR 68 | Temp 96.2°F | Resp 22 | Ht 63.0 in | Wt 133.0 lb

## 2022-05-04 DIAGNOSIS — E7800 Pure hypercholesterolemia, unspecified: Secondary | ICD-10-CM | POA: Diagnosis not present

## 2022-05-04 DIAGNOSIS — Z09 Encounter for follow-up examination after completed treatment for conditions other than malignant neoplasm: Secondary | ICD-10-CM | POA: Diagnosis not present

## 2022-05-04 DIAGNOSIS — Z860100 Personal history of colon polyps, unspecified: Secondary | ICD-10-CM

## 2022-05-04 MED ORDER — SODIUM CHLORIDE 0.9 % IV SOLN: 500.0000 mL | INTRAVENOUS | Status: DC

## 2022-05-04 NOTE — Op Note (Signed)
[place] Patient Name: Janice [name] Procedure Date: 05/04/2022 2:23 PM MRN: [id number] Endoscopist: Justice Britain , MD, [id number] Age: 71 Referring MD:  Date of Birth: Oct 27, 1950 Gender: Female Account #: [id number] Procedure:                Colonoscopy Indications:              Surveillance: Personal history of adenomatous                            polyps on last colonoscopy > 5 years ago Medicines:                Monitored Anesthesia Care Procedure:                Pre-Anesthesia Assessment:                           - Prior to the procedure, a History and Physical                            was performed, and patient medications and                            allergies were reviewed. The patient's tolerance of                            previous anesthesia was also reviewed. The risks                            and benefits of the procedure and the sedation                            options and risks were discussed with the patient.                            All questions were answered, and informed consent                            was obtained. Prior Anticoagulants: The patient has                            taken no anticoagulant or antiplatelet agents. ASA                            Grade Assessment: II - A patient with mild systemic                            disease. After reviewing the risks and benefits,                            the patient was deemed in satisfactory condition to                            undergo the procedure.  After obtaining informed consent, the colonoscope                            was passed under direct vision. Throughout the                            procedure, the patient's blood pressure, pulse, and                            oxygen saturations were monitored continuously. The                            PCF-HQ190L Colonoscope was introduced through the                            anus and  advanced to the the cecum, identified by                            its appearance. The colonoscopy was somewhat                            difficult due to restricted mobility of the colon                            and a tortuous colon. Successful completion of the                            procedure was aided by changing the patient's                            position, using manual pressure, straightening and                            shortening the scope to obtain bowel loop reduction                            and using scope torsion. The patient tolerated the                            procedure. The quality of the bowel preparation was                            adequate. The ileocecal valve, appendiceal orifice,                            and rectum were photographed. Scope In: 2:31:46 PM Scope Out: 2:49:45 PM Scope Withdrawal Time: 0 hours 11 minutes 4 seconds  Total Procedure Duration: 0 hours 17 minutes 59 seconds  Findings:                 The digital rectal exam findings include                            hemorrhoids. Pertinent negatives include no  palpable rectal lesions.                           The left colon was grossly tortuous.                           Many medium-mouthed and small-mouthed diverticula                            were found in the entire colon.                           Normal mucosa was found in the entire colon                            otherwise.                           Non-bleeding non-thrombosed external and internal                            hemorrhoids were found during retroflexion, during                            perianal exam and during digital exam. The                            hemorrhoids were Grade II (internal hemorrhoids                            that prolapse but reduce spontaneously). Complications:            No immediate complications. Estimated Blood Loss:     Estimated blood loss was  minimal. Impression:               - Hemorrhoids found on digital rectal exam.                           - Tortuous colon.                           - Diverticulosis in the entire examined colon.                           - Normal mucosa in the entire examined colon                            otherwise.                           - Non-bleeding non-thrombosed external and internal                            hemorrhoids. Recommendation:           - The patient will be observed post-procedure,  until all discharge criteria are met.                           - Discharge patient to home.                           - Patient has a contact number available for                            emergencies. The signs and symptoms of potential                            delayed complications were discussed with the                            patient. Return to normal activities tomorrow.                            Written discharge instructions were provided to the                            patient.                           - High fiber diet.                           - Use FiberCon 1-2 tablets PO daily.                           - Continue present medications.                           - Repeat colonoscopy in 7 years for surveillance.                           - The findings and recommendations were discussed                            with the patient.                           - The findings and recommendations were discussed                            with the patient's family. Justice Britain, MD 05/04/2022 2:59:17 PM

## 2022-05-04 NOTE — Progress Notes (Signed)
Sedate, gd SR, tolerated procedure well, VSS, report to RN 

## 2022-05-04 NOTE — Progress Notes (Signed)
GASTROENTEROLOGY PROCEDURE H&P NOTE   Primary Care Physician: Kathyrn Lass, MD  HPI: Janice [name] is a 70 y.o. female who presents for colonoscopy for surveillance of colon adenomas.  Past Medical History:  Diagnosis Date   Cancer ([place]) 06/2020   left breast cancer   Family history of melanoma    Hypercholesterolemia    Personal history of radiation therapy    Past Surgical History:  Procedure Laterality Date   BREAST BIOPSY Left    Pt unsure of date  over 15 years ago- [facility]    BREAST EXCISIONAL BIOPSY Right    in her 65s   breast lift Bilateral    BREAST LUMPECTOMY Right    benign   BREAST LUMPECTOMY WITH RADIOACTIVE SEED LOCALIZATION Left 07/26/2020   Procedure: LEFT BREAST LUMPECTOMY WITH BRACKETED RADIOACTIVE SEED LOCALIZATION;  Surgeon: Stark Klein, MD;  Location: [facility];  Service: General;  Laterality: Left;  RNFA; START TIME OF 11:45 AM FOR 60 MINUTES ROOM 5 BYER[facility] IQ   BUNIONECTOMY Right    lower body lift     MOHS SURGERY     bridge of nose   REDUCTION MAMMAPLASTY Bilateral    TONSILLECTOMY AND ADENOIDECTOMY     age 26   TUBAL LIGATION     Current Outpatient Medications  Medication Sig Dispense Refill   ALPRAZolam (XANAX) 0.25 MG tablet TAKE 1 TABLET BY MOUTH AT BEDTIME AS NEEDED FOR ANXIETY 30 tablet 1   Apoaequorin (PREVAGEN EXTRA STRENGTH) 20 MG CAPS See admin instructions.     b complex vitamins tablet Take 1 tablet by mouth daily.     calcium-vitamin D (OSCAL WITH D) 500-200 MG-UNIT tablet Take 1 tablet by mouth.     Citrus Bergamot 650 MG TABS Take 2 capsules by mouth daily.     Coenzyme Q10 300 MG CAPS Take by mouth.     Evening Primrose Oil 500 MG CAPS Take 1 capsule by mouth daily.     Glucos-MSM-C-Mn-Ginger-Willow (GLUCOSAMINE MSM COMPLEX PO) Take 1,500 mg by mouth daily.     Linoleic Acid Conjugated 1000 MG CAPS Take 4 capsules (4,000 mg total) by mouth daily.     Lutein-Zeaxanthin 25-5 MG CAPS      magnesium oxide  (MAG-OX) 400 MG tablet Take 400 mg by mouth daily.     Multiple Vitamin (MULTIVITAMIN) tablet Take 1 tablet by mouth daily.     Multiple Vitamins-Minerals (HAIR SKIN AND NAILS FORMULA) TABS See admin instructions.     Nutritional Supplements (ESTROVEN PO) Take by mouth daily.     Probiotic Product (PROBIOTIC DAILY PO) Take by mouth daily.     S-Adenosylmethionine (SAM-E) 400 MG TABS Take 1 tablet (400 mg total) by mouth in the morning and at bedtime.     Spirulina 500 MG TABS Take 500 mg by mouth daily.     tamoxifen (NOLVADEX) 20 MG tablet TAKE 1 TABLET BY MOUTH EVERY DAY 90 tablet 3   Turmeric 500 MG CAPS Take 1,000 mg by mouth daily.     Wheat Dextrin (BENEFIBER) POWD Take 1 Dose by mouth in the morning and at bedtime.  0   zinc gluconate 50 MG tablet Take 50 mg by mouth daily.     amoxicillin (AMOXIL) 500 MG capsule Take by mouth.     oxyCODONE-acetaminophen (PERCOCET) 7.5-325 MG tablet Take 1 tablet by mouth every 6 (six) hours as needed.     Current Facility-Administered Medications  Medication Dose Route Frequency Provider Last  Rate Last Admin   0.9 %  sodium chloride infusion  500 mL Intravenous Continuous Mansouraty, Telford Nab., MD        Current Outpatient Medications:    ALPRAZolam (XANAX) 0.25 MG tablet, TAKE 1 TABLET BY MOUTH AT BEDTIME AS NEEDED FOR ANXIETY, Disp: 30 tablet, Rfl: 1   Apoaequorin (PREVAGEN EXTRA STRENGTH) 20 MG CAPS, See admin instructions., Disp: , Rfl:    b complex vitamins tablet, Take 1 tablet by mouth daily., Disp: , Rfl:    calcium-vitamin D (OSCAL WITH D) 500-200 MG-UNIT tablet, Take 1 tablet by mouth., Disp: , Rfl:    Citrus Bergamot 650 MG TABS, Take 2 capsules by mouth daily., Disp: , Rfl:    Coenzyme Q10 300 MG CAPS, Take by mouth., Disp: , Rfl:    Evening Primrose Oil 500 MG CAPS, Take 1 capsule by mouth daily., Disp: , Rfl:    Glucos-MSM-C-Mn-Ginger-Willow (GLUCOSAMINE MSM COMPLEX PO), Take 1,500 mg by mouth daily., Disp: , Rfl:    Linoleic Acid  Conjugated 1000 MG CAPS, Take 4 capsules (4,000 mg total) by mouth daily., Disp: , Rfl:    Lutein-Zeaxanthin 25-5 MG CAPS, , Disp: , Rfl:    magnesium oxide (MAG-OX) 400 MG tablet, Take 400 mg by mouth daily., Disp: , Rfl:    Multiple Vitamin (MULTIVITAMIN) tablet, Take 1 tablet by mouth daily., Disp: , Rfl:    Multiple Vitamins-Minerals (HAIR SKIN AND NAILS FORMULA) TABS, See admin instructions., Disp: , Rfl:    Nutritional Supplements (ESTROVEN PO), Take by mouth daily., Disp: , Rfl:    Probiotic Product (PROBIOTIC DAILY PO), Take by mouth daily., Disp: , Rfl:    S-Adenosylmethionine (SAM-E) 400 MG TABS, Take 1 tablet (400 mg total) by mouth in the morning and at bedtime., Disp: , Rfl:    Spirulina 500 MG TABS, Take 500 mg by mouth daily., Disp: , Rfl:    tamoxifen (NOLVADEX) 20 MG tablet, TAKE 1 TABLET BY MOUTH EVERY DAY, Disp: 90 tablet, Rfl: 3   Turmeric 500 MG CAPS, Take 1,000 mg by mouth daily., Disp: , Rfl:    Wheat Dextrin (BENEFIBER) POWD, Take 1 Dose by mouth in the morning and at bedtime., Disp: , Rfl: 0   zinc gluconate 50 MG tablet, Take 50 mg by mouth daily., Disp: , Rfl:    amoxicillin (AMOXIL) 500 MG capsule, Take by mouth., Disp: , Rfl:    oxyCODONE-acetaminophen (PERCOCET) 7.5-325 MG tablet, Take 1 tablet by mouth every 6 (six) hours as needed., Disp: , Rfl:   Current Facility-Administered Medications:    0.9 %  sodium chloride infusion, 500 mL, Intravenous, Continuous, Mansouraty, Telford Nab., MD No Known Allergies Family History  Problem Relation Age of Onset   Melanoma Mother        dx in 55s-70s   Dementia Mother    Dementia Father    Melanoma Brother 70   Congestive Heart Failure Maternal Grandmother    AAA (abdominal aortic aneurysm) Maternal Grandfather    Melanoma Paternal Grandfather 97   Lung cancer Paternal Grandfather    Colon cancer Neg Hx    Colon polyps Neg Hx    Esophageal cancer Neg Hx    Rectal cancer Neg Hx    Stomach cancer Neg Hx    Social  History   Socioeconomic History   Marital status: Divorced    Spouse name: Not on file   Number of children: Not on file   Years of education: Not on file  Highest education level: Not on file  Occupational History   Not on file  Tobacco Use   Smoking status: Never   Smokeless tobacco: Never  Substance and Sexual Activity   Alcohol use: Yes    Alcohol/week: 2.0 standard drinks of alcohol    Types: 2 Glasses of wine per week    Comment: social   Drug use: No   Sexual activity: Yes    Birth control/protection: Post-menopausal, Surgical  Other Topics Concern   Not on file  Social History Narrative   Not on file   Social Determinants of Health   Financial Resource Strain: Not on file  Food Insecurity: Not on file  Transportation Needs: Not on file  Physical Activity: Not on file  Stress: Not on file  Social Connections: Not on file  Intimate Partner Violence: Not At Risk (07/28/2020)   Humiliation, Afraid, Rape, and Kick questionnaire    Fear of Current or Ex-Partner: No    Emotionally Abused: No    Physically Abused: No    Sexually Abused: No    Physical Exam: Today's Vitals   05/04/22 1331 05/04/22 1333  BP: 131/78   Pulse: 83   Temp: (!) 96.2 F (35.7 C) (!) 96.2 F (35.7 C)  SpO2: 97%   Weight: 133 lb (60.3 kg)   Height: '5\' 3"'$  (1.6 m)    Body mass index is 23.56 kg/m. GEN: NAD EYE: Sclerae anicteric ENT: MMM CV: Non-tachycardic GI: Soft, NT/ND NEURO:  Alert & Oriented x 3  Lab Results: No results for input(s): "WBC", "HGB", "HCT", "PLT" in the last 72 hours. BMET No results for input(s): "NA", "K", "CL", "CO2", "GLUCOSE", "BUN", "CREATININE", "CALCIUM" in the last 72 hours. LFT No results for input(s): "PROT", "ALBUMIN", "AST", "ALT", "ALKPHOS", "BILITOT", "BILIDIR", "IBILI" in the last 72 hours. PT/INR No results for input(s): "LABPROT", "INR" in the last 72 hours.   Impression / Plan: This is a 71 y.o.female who presents for colonoscopy for  surveillance of colon adenomas.  The risks and benefits of endoscopic evaluation/treatment were discussed with the patient and/or family; these include but are not limited to the risk of perforation, infection, bleeding, missed lesions, lack of diagnosis, severe illness requiring hospitalization, as well as anesthesia and sedation related illnesses.  The patient's history has been reviewed, patient examined, no change in status, and deemed stable for procedure.  The patient and/or family is agreeable to proceed.    Justice Britain, MD [facility] Advanced [facility]oscopy Office # [phone]

## 2022-05-04 NOTE — Patient Instructions (Signed)
Follow a high fiber diet. Resume previous medications. Repeat Colonoscopy in 7 years for surveillance. Handouts provided on Diverticulosis and Hemorrhoids  YOU HAD AN ENDOSCOPIC PROCEDURE TODAY AT [facility]:   Refer to the procedure report that was given to you for any specific questions about what was found during the examination.  If the procedure report does not answer your questions, please call your gastroenterologist to clarify.  If you requested that your care partner not be given the details of your procedure findings, then the procedure report has been included in a sealed envelope for you to review at your convenience later.  YOU SHOULD EXPECT: Some feelings of bloating in the abdomen. Passage of more gas than usual.  Walking can help get rid of the air that was put into your GI tract during the procedure and reduce the bloating. If you had a lower endoscopy (such as a colonoscopy or flexible sigmoidoscopy) you may notice spotting of blood in your stool or on the toilet paper. If you underwent a bowel prep for your procedure, you may not have a normal bowel movement for a few days.  Please Note:  You might notice some irritation and congestion in your nose or some drainage.  This is from the oxygen used during your procedure.  There is no need for concern and it should clear up in a day or so.  SYMPTOMS TO REPORT IMMEDIATELY:  Following lower endoscopy (colonoscopy or flexible sigmoidoscopy):  Excessive amounts of blood in the stool  Significant tenderness or worsening of abdominal pains  Swelling of the abdomen that is new, acute  Fever of 100F or higher  For urgent or emergent issues, a gastroenterologist can be reached at any hour by calling [phone]. Do not use MyChart messaging for urgent concerns.    DIET:  We do recommend a small meal at first, but then you may proceed to your regular diet.  Drink plenty of fluids but you should avoid alcoholic  beverages for 24 hours.  ACTIVITY:  You should plan to take it easy for the rest of today and you should NOT DRIVE or use heavy machinery until tomorrow (because of the sedation medicines used during the test).    FOLLOW UP: Our staff will call the number listed on your records the next business day following your procedure.  We will call around 7:15- 8:00 am to check on you and address any questions or concerns that you may have regarding the information given to you following your procedure. If we do not reach you, we will leave a message.     If any biopsies were taken you will be contacted by phone or by letter within the next 1-3 weeks.  Please call us at [phone] if you have not heard about the biopsies in 3 weeks.    SIGNATURES/CONFIDENTIALITY: You and/or your care partner have signed paperwork which will be entered into your electronic medical record.  These signatures attest to the fact that that the information above on your After Visit Summary has been reviewed and is understood.  Full responsibility of the confidentiality of this discharge information lies with you and/or your care-partner.

## 2022-05-04 NOTE — Progress Notes (Signed)
Pt's states no medical or surgical changes since previsit or office visit. 

## 2022-05-09 ENCOUNTER — Telehealth: Payer: Self-pay

## 2022-05-09 NOTE — Telephone Encounter (Signed)
  Follow up Call-     05/04/2022    1:33 PM  Call back number  Post procedure Call Back phone  # [phone]  Permission to leave phone message Yes     Patient questions:  Do you have a fever, pain , or abdominal swelling? No. Pain Score  0 *  Have you tolerated food without any problems? Yes.    Have you been able to return to your normal activities? Yes.    Do you have any questions about your discharge instructions: Diet   No. Medications  No. Follow up visit  No.  Do you have questions or concerns about your Care? No.  Actions: * If pain score is 4 or above: No action needed, pain <4.

## 2022-05-10 ENCOUNTER — Other Ambulatory Visit: Payer: Self-pay | Admitting: Family Medicine

## 2022-05-10 DIAGNOSIS — Z853 Personal history of malignant neoplasm of breast: Secondary | ICD-10-CM

## 2022-06-25 NOTE — Assessment & Plan Note (Signed)
07/13/2020:Screening mammogram showed left breast calcifications. Diagnostic mammogram showed calcifications in the left breast spanning 2.3cm. Biopsy on 07/07/20 showed high grade DCIS, ER+ 100%, PR+ 70%.  07/26/2020:Left lumpectomy Barry Dienes): high grade DCIS partially involving an intraductal papilloma, clear margins.  ER 100%, PR 70% Adjuvant radiation: 08/30/2020 to 09/23/2020   Treatment plan: Adjuvant antiestrogen therapy with tamoxifen 20 mg once daily x5 years started 10/05/2020 Tamoxifen toxicities: 1.  Hot flashes: mild  She takes [place] which contains black cohosh. 2. severe anxiety intermittently: I gave her a prescription for Xanax to be taken as needed.  Especially when she travels she needs the antianxiety medication.   She walks 4 to 6 miles a day.   Breast cancer surveillance: 1.  Breast exam 06/26/2022: Benign 2. mammogram 06/19/2021: Benign breast density category B   Return to clinic in 1 year for follow-up

## 2022-06-25 NOTE — Progress Notes (Signed)
Patient Care Team: Kathyrn Lass, MD as PCP - General (Family Medicine) Kyung Rudd, MD as Consulting Physician (Radiation Oncology) Stark Klein, MD as Consulting Physician (General Surgery) Nicholas Lose, MD as Consulting Physician (Hematology and Oncology)  DIAGNOSIS: No diagnosis found.  SUMMARY OF ONCOLOGIC HISTORY: Oncology History  Ductal carcinoma in situ (DCIS) of left breast  07/07/2020 Cancer Staging   Staging form: Breast, AJCC 8th Edition - Clinical stage from 07/07/2020: Stage 0 (cTis (DCIS), cN0, cM0, ER+, PR+) - Signed by Gardenia Phlegm, NP on 07/13/2020 Nuclear grade: G3   07/13/2020 Initial Diagnosis   Screening mammogram showed left breast calcifications. Diagnostic mammogram showed calcifications in the left breast spanning 2.3cm. Biopsy on 07/07/20 showed high grade DCIS, ER+ 100%, PR+ 70%.    07/26/2020 Surgery   Left lumpectomy Barry Dienes): high grade DCIS partially involving an intraductal papilloma, clear margins.   07/26/2020 Cancer Staging   Staging form: Breast, AJCC 8th Edition - Pathologic stage from 07/26/2020: Stage 0 (pTis (DCIS), pN0, cM0, ER+, PR+) - Signed by Gardenia Phlegm, NP on 08/10/2020 Stage prefix: Initial diagnosis   08/29/2020 - 09/23/2020 Radiation Therapy   Adjuvant radiation Site Technique Total Dose (Gy) Dose per Fx (Gy) Completed Fx Beam Energies  Breast, Left: Breast_Lt 3D 42.56/42.56 2.66 16/16 6X  Breast, Left: Breast_Lt_Bst 3D 10/10 2.5 4/4 6X    10/24/2020 Genetic Testing   Negative genetic testing on the CancerNext-Expanded+RNAinsight panel.  The CancerNext-Expanded gene panel offered by [facility] and includes sequencing and rearrangement analysis for the following 77 genes: AIP, ALK, APC*, ATM*, AXIN2, BAP1, BARD1, BLM, BMPR1A, BRCA1*, BRCA2*, BRIP1*, CDC73, CDH1*, CDK4, CDKN1B, CDKN2A, CHEK2*, CTNNA1, DICER1, FANCC, FH, FLCN, GALNT12, KIF1B, LZTR1, MAX, MEN1, MET, MLH1*, MSH2*, MSH3, MSH6*, MUTYH*, NBN, NF1*, NF2,  NTHL1, PALB2*, PHOX2B, PMS2*, POT1, PRKAR1A, PTCH1, PTEN*, RAD51C*, RAD51D*, RB1, RECQL, RET, SDHA, SDHAF2, SDHB, SDHC, SDHD, SMAD4, SMARCA4, SMARCB1, SMARCE1, STK11, SUFU, TMEM127, TP53*, TSC1, TSC2, VHL and XRCC2 (sequencing and deletion/duplication); EGFR, EGLN1, HOXB13, KIT, MITF, PDGFRA, POLD1, and POLE (sequencing only); EPCAM and GREM1 (deletion/duplication only). DNA and RNA analyses performed for * genes. The report date is October 24, 2020.   10/2020 -  Anti-estrogen oral therapy   Tamoxifen daily     CHIEF COMPLIANT:   INTERVAL HISTORY: Janice Janice [name]   ALLERGIES:  has No Known Allergies.  MEDICATIONS:  Current Outpatient Medications  Medication Sig Dispense Refill   ALPRAZolam (XANAX) 0.25 MG tablet TAKE 1 TABLET BY MOUTH AT BEDTIME AS NEEDED FOR ANXIETY 30 tablet 1   Apoaequorin (PREVAGEN EXTRA STRENGTH) 20 MG CAPS See admin instructions.     b complex vitamins tablet Take 1 tablet by mouth daily.     calcium-vitamin D (OSCAL WITH D) 500-200 MG-UNIT tablet Take 1 tablet by mouth.     Citrus Bergamot 650 MG TABS Take 2 capsules by mouth daily.     Coenzyme Q10 300 MG CAPS Take by mouth.     Evening Primrose Oil 500 MG CAPS Take 1 capsule by mouth daily.     Glucos-MSM-C-Mn-Ginger-Willow (GLUCOSAMINE MSM COMPLEX PO) Take 1,500 mg by mouth daily.     Linoleic Acid Conjugated 1000 MG CAPS Take 4 capsules (4,000 mg total) by mouth daily.     Lutein-Zeaxanthin 25-5 MG CAPS      magnesium oxide (MAG-OX) 400 MG tablet Take 400 mg by mouth daily.     Multiple Vitamin (MULTIVITAMIN) tablet Take 1 tablet by mouth daily.     Multiple  Vitamins-Minerals (HAIR SKIN AND NAILS FORMULA) TABS See admin instructions.     Nutritional Supplements (ESTROVEN PO) Take by mouth daily.     Probiotic Product (PROBIOTIC DAILY PO) Take by mouth daily.     S-Adenosylmethionine (SAM-E) 400 MG TABS Take 1 tablet (400 mg total) by mouth in the morning and at bedtime.     Spirulina 500 MG TABS  Take 500 mg by mouth daily.     tamoxifen (NOLVADEX) 20 MG tablet TAKE 1 TABLET BY MOUTH EVERY DAY 90 tablet 3   Turmeric 500 MG CAPS Take 1,000 mg by mouth daily.     Wheat Dextrin (BENEFIBER) POWD Take 1 Dose by mouth in the morning and at bedtime.  0   zinc gluconate 50 MG tablet Take 50 mg by mouth daily.     No current facility-administered medications for this visit.    PHYSICAL EXAMINATION: ECOG PERFORMANCE STATUS: {CHL ONC ECOG PS:[id number]}  There were no vitals filed for this visit. There were no vitals filed for this visit.  BREAST:*** No palpable masses or nodules in either right or left breasts. No palpable axillary supraclavicular or infraclavicular adenopathy no breast tenderness or nipple discharge. (exam performed in the presence of Janice [name] chaperone)  LABORATORY DATA:  I have reviewed the data as listed     No data to display          No results found for: "WBC", "HGB", "HCT", "MCV", "PLT", "NEUTROABS"  ASSESSMENT & PLAN:  No problem-specific Assessment & Plan notes found for this encounter.    No orders of the defined types were placed in this encounter.  The patient has Janice [name] good understanding of the overall plan. she agrees with it. she will call with any problems that may develop before the next visit here. Total time spent: 30 mins including face to face time and time spent for planning, charting and co-ordination of care   Suzzette Righter, [place] 06/25/22    I Gardiner Coins am acting as Janice [name] Education administrator for [employer]  ***

## 2022-06-26 ENCOUNTER — Inpatient Hospital Stay: Payer: PPO | Attending: Hematology and Oncology | Admitting: Hematology and Oncology

## 2022-06-26 VITALS — BP 129/72 | HR 79 | Temp 97.2°F | Resp 18 | Wt 131.4 lb

## 2022-06-26 DIAGNOSIS — R232 Flushing: Secondary | ICD-10-CM | POA: Diagnosis not present

## 2022-06-26 DIAGNOSIS — D0512 Intraductal carcinoma in situ of left breast: Secondary | ICD-10-CM | POA: Diagnosis not present

## 2022-06-26 DIAGNOSIS — Z79810 Long term (current) use of selective estrogen receptor modulators (SERMs): Secondary | ICD-10-CM | POA: Diagnosis not present

## 2022-06-26 MED ORDER — TAMOXIFEN CITRATE 20 MG PO TABS: 20.0000 mg | ORAL_TABLET | Freq: Every day | ORAL | 3 refills | Status: DC

## 2022-07-02 ENCOUNTER — Encounter: Payer: Self-pay | Admitting: Family Medicine

## 2022-07-03 ENCOUNTER — Ambulatory Visit
Admission: RE | Admit: 2022-07-03 | Discharge: 2022-07-03 | Disposition: A | Discharge status: Home / Self Care | Payer: PPO | Source: Ambulatory Visit | Attending: Family Medicine | Admitting: Family Medicine

## 2022-07-03 DIAGNOSIS — Z853 Personal history of malignant neoplasm of breast: Secondary | ICD-10-CM | POA: Diagnosis not present

## 2022-07-03 DIAGNOSIS — R922 Inconclusive mammogram: Secondary | ICD-10-CM | POA: Diagnosis not present

## 2022-08-14 ENCOUNTER — Other Ambulatory Visit: Payer: Self-pay | Admitting: Hematology and Oncology

## 2022-11-13 DIAGNOSIS — D1801 Hemangioma of skin and subcutaneous tissue: Secondary | ICD-10-CM | POA: Diagnosis not present

## 2022-11-13 DIAGNOSIS — L821 Other seborrheic keratosis: Secondary | ICD-10-CM | POA: Diagnosis not present

## 2022-11-13 DIAGNOSIS — D229 Melanocytic nevi, unspecified: Secondary | ICD-10-CM | POA: Diagnosis not present

## 2022-11-13 DIAGNOSIS — L578 Other skin changes due to chronic exposure to nonionizing radiation: Secondary | ICD-10-CM | POA: Diagnosis not present

## 2022-11-13 DIAGNOSIS — L570 Actinic keratosis: Secondary | ICD-10-CM | POA: Diagnosis not present

## 2022-11-13 DIAGNOSIS — L814 Other melanin hyperpigmentation: Secondary | ICD-10-CM | POA: Diagnosis not present

## 2023-01-02 DIAGNOSIS — R3915 Urgency of urination: Secondary | ICD-10-CM | POA: Diagnosis not present

## 2023-01-02 DIAGNOSIS — I700 Atherosclerosis of aorta: Secondary | ICD-10-CM | POA: Diagnosis not present

## 2023-01-02 DIAGNOSIS — M8580 Other specified disorders of bone density and structure, unspecified site: Secondary | ICD-10-CM | POA: Diagnosis not present

## 2023-01-02 DIAGNOSIS — Z6823 Body mass index (BMI) 23.0-23.9, adult: Secondary | ICD-10-CM | POA: Diagnosis not present

## 2023-01-02 DIAGNOSIS — I2510 Atherosclerotic heart disease of native coronary artery without angina pectoris: Secondary | ICD-10-CM | POA: Diagnosis not present

## 2023-01-02 DIAGNOSIS — M21612 Bunion of left foot: Secondary | ICD-10-CM | POA: Diagnosis not present

## 2023-01-02 DIAGNOSIS — Z853 Personal history of malignant neoplasm of breast: Secondary | ICD-10-CM | POA: Diagnosis not present

## 2023-01-02 DIAGNOSIS — Z0000 Encounter for general adult medical examination without abnormal findings: Secondary | ICD-10-CM | POA: Diagnosis not present

## 2023-01-09 ENCOUNTER — Other Ambulatory Visit: Payer: Self-pay | Admitting: Hematology and Oncology

## 2023-01-11 ENCOUNTER — Other Ambulatory Visit: Payer: Self-pay

## 2023-01-11 MED ORDER — ALPRAZOLAM 0.25 MG PO TABS: 0.2500 mg | ORAL_TABLET | Freq: Every evening | ORAL | 1 refills | Status: DC | PRN

## 2023-02-15 IMAGING — MG MM PLC BREAST LOC DEV 1ST LESION INC MAMMO GUIDE*L*
6 series · 6 of 6 positions shown · non-contrast
Comparison: Previous exam(s).

CLINICAL DATA: Patient presents for radioactive seed localization
recently diagnosed left breast high-grade DCIS. Two seeds will be
placed to bracket the extent of calcifications.

EXAM:
MAMMOGRAPHIC GUIDED RADIOACTIVE SEED LOCALIZATION OF THE LEFT
BREAST: 2 RADIOACTIVE SEEDS PLACED

[L LM (1 of 2)]
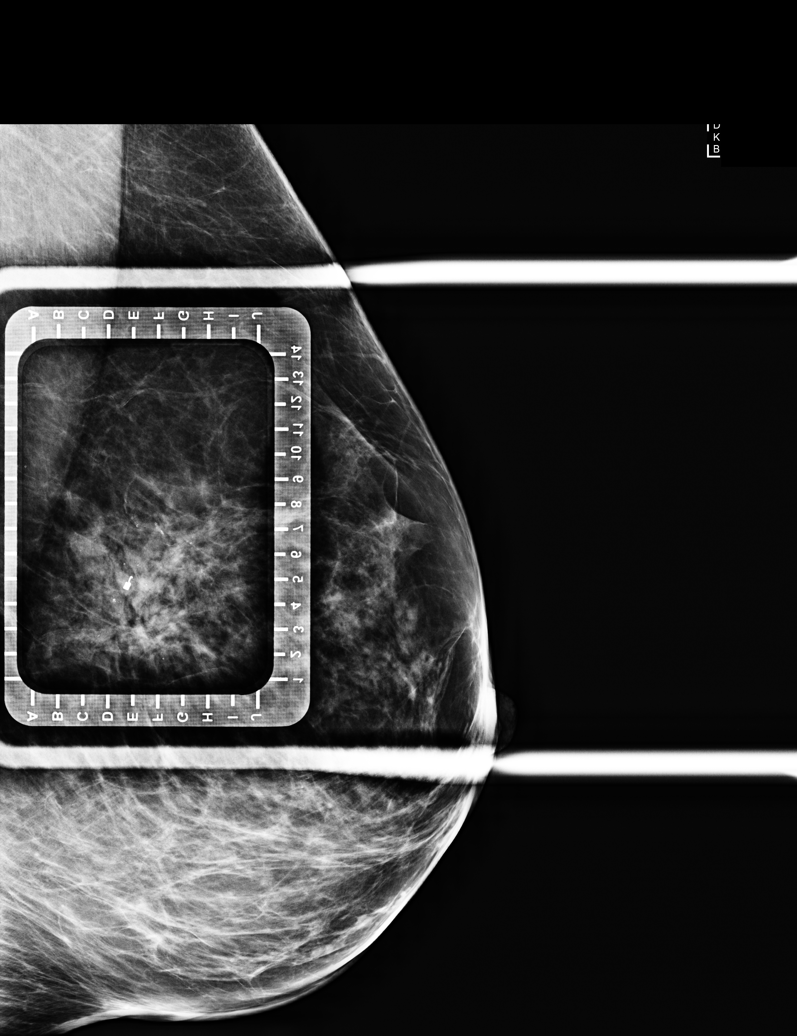

[L ML]
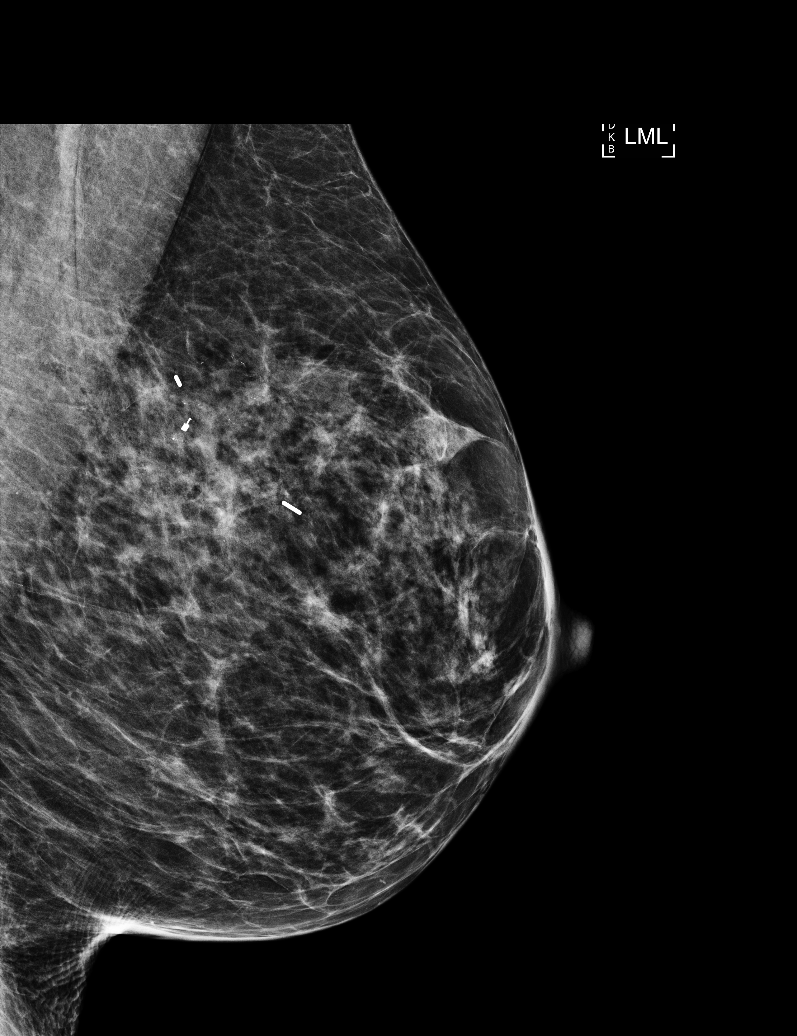

[L CC (1 of 3)]
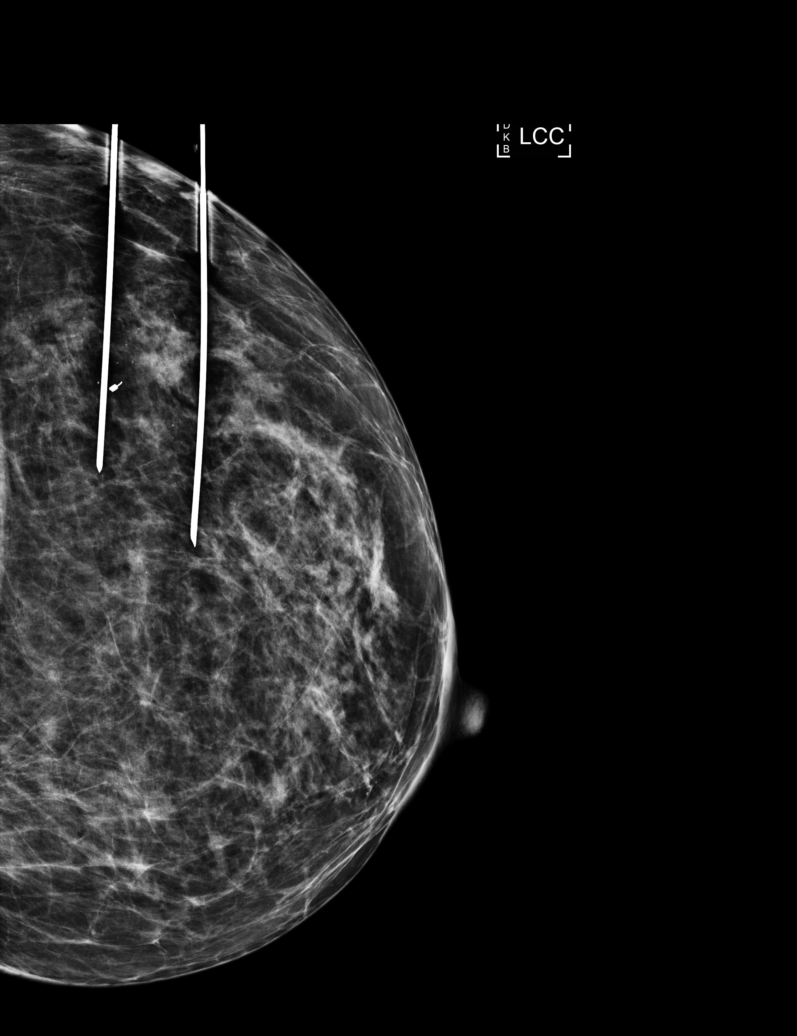

[L LM (2 of 2)]
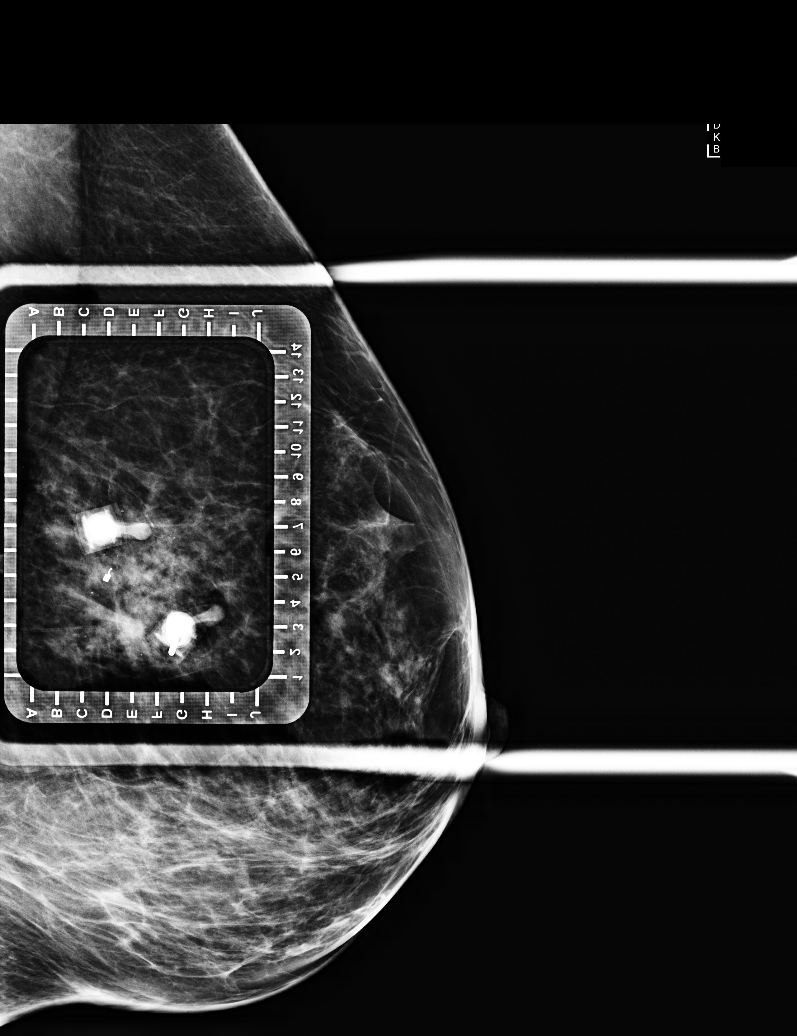

[L CC (2 of 3)]
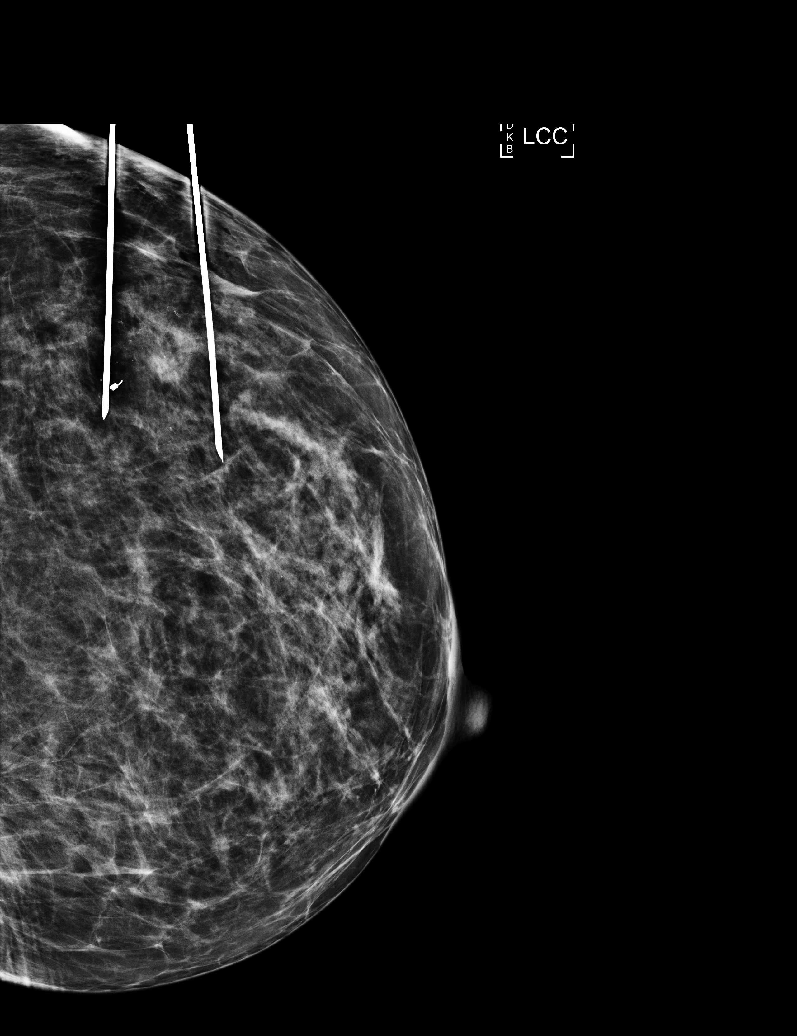

[L CC (3 of 3)]
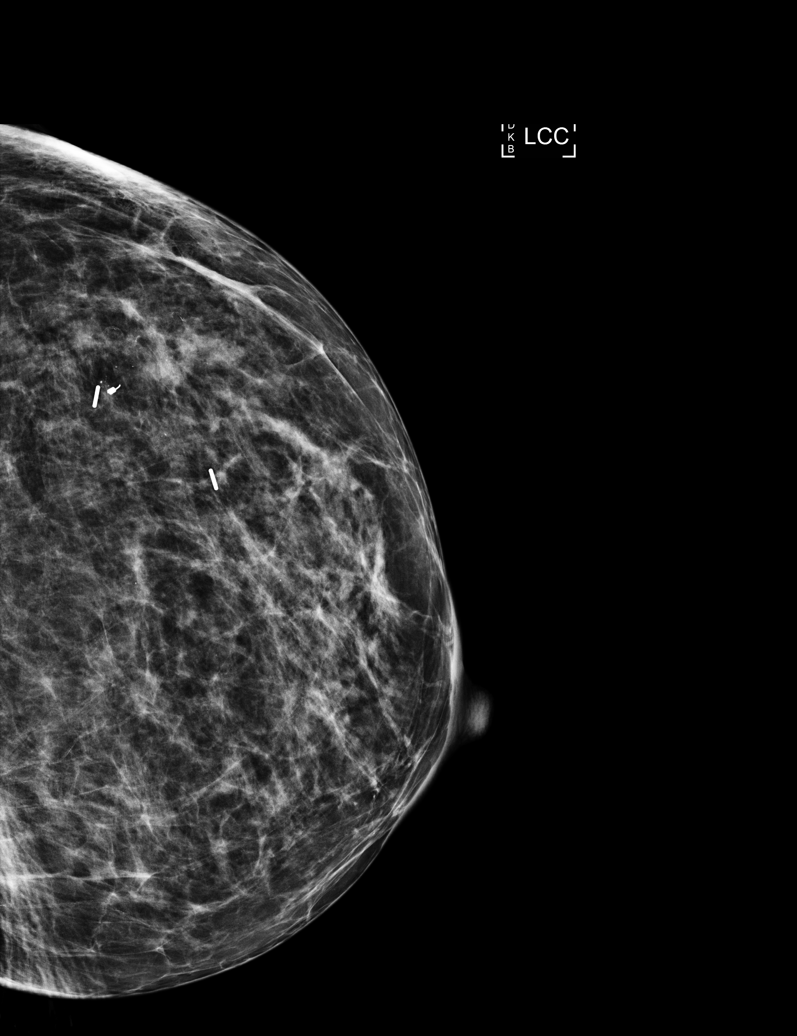

[6 of 6 positions shown; findings below may reference images not displayed]

FINDINGS: Patient presents for radioactive seed localization prior to surgical
excision. I met with the patient and we discussed the procedure of
seed localization including benefits and alternatives. We discussed
the high likelihood of a successful procedure. We discussed the
risks of the procedure including infection, bleeding, tissue injury
and further surgery. We discussed the low dose of radioactivity
involved in the procedure. Informed, written consent was given.

The usual time-out protocol was performed immediately prior to the
procedure.

Anterior extent of calcifications

Using mammographic guidance, sterile technique, 1% lidocaine and an
0-EZF radioactive seed, the anterior extent of the calcifications,
2.3 cm anterior and slightly medial to the coil shaped biopsy clip,
was localized using a lateral approach. The follow-up mammogram
images confirm the seed in the expected location and were marked for
Dr. Jumper.

Follow-up survey of the patient confirms presence of the radioactive
seed.

Order number of 0-EZF seed:  [id number].

Total activity:  0.247 millicurie reference Date: 07/07/2020

Posterior extent of calcifications

Using mammographic guidance, sterile technique, 1% lidocaine and an
0-EZF radioactive seed, the posterior extent of the calcifications
was localized using a lateral approach. The follow-up mammogram
images confirm the seed in the expected location and were marked for
Dr. Jumper.

Follow-up survey of the patient confirms presence of the radioactive
seed.

Order number of 0-EZF seed:  [id number].

Total activity:  0.247 millicurie reference Date: 07/07/2020

The patient tolerated the procedure well and was released from the
[REDACTED]. She was given instructions regarding seed removal.
IMPRESSION: Radioactive seed localization of the left breast. Two seeds placed
to bracket calcifications reflecting DCIS. No apparent
complications.

## 2023-05-16 ENCOUNTER — Other Ambulatory Visit: Payer: Self-pay | Admitting: Hematology and Oncology

## 2023-05-16 DIAGNOSIS — Z853 Personal history of malignant neoplasm of breast: Secondary | ICD-10-CM

## 2023-05-23 ENCOUNTER — Other Ambulatory Visit: Payer: Self-pay | Admitting: Hematology and Oncology

## 2023-06-12 DIAGNOSIS — Z124 Encounter for screening for malignant neoplasm of cervix: Secondary | ICD-10-CM | POA: Diagnosis not present

## 2023-06-27 ENCOUNTER — Inpatient Hospital Stay: Payer: PPO | Admitting: Hematology and Oncology

## 2023-07-05 ENCOUNTER — Ambulatory Visit
Admission: RE | Admit: 2023-07-05 | Discharge: 2023-07-05 | Disposition: A | Discharge status: Home / Self Care | Payer: PPO | Source: Ambulatory Visit | Attending: Hematology and Oncology | Admitting: Hematology and Oncology

## 2023-07-05 DIAGNOSIS — Z853 Personal history of malignant neoplasm of breast: Secondary | ICD-10-CM

## 2023-07-15 ENCOUNTER — Inpatient Hospital Stay: Payer: PPO | Attending: Hematology and Oncology | Admitting: Hematology and Oncology

## 2023-07-15 VITALS — BP 125/72 | HR 74 | Temp 98.7°F | Resp 17 | Ht 63.0 in | Wt 139.8 lb

## 2023-07-15 DIAGNOSIS — Z170 Estrogen receptor positive status [ER+]: Secondary | ICD-10-CM | POA: Insufficient documentation

## 2023-07-15 DIAGNOSIS — Z79810 Long term (current) use of selective estrogen receptor modulators (SERMs): Secondary | ICD-10-CM | POA: Diagnosis not present

## 2023-07-15 DIAGNOSIS — Z923 Personal history of irradiation: Secondary | ICD-10-CM | POA: Insufficient documentation

## 2023-07-15 DIAGNOSIS — Z79899 Other long term (current) drug therapy: Secondary | ICD-10-CM | POA: Insufficient documentation

## 2023-07-15 DIAGNOSIS — N951 Menopausal and female climacteric states: Secondary | ICD-10-CM | POA: Insufficient documentation

## 2023-07-15 DIAGNOSIS — G4700 Insomnia, unspecified: Secondary | ICD-10-CM | POA: Insufficient documentation

## 2023-07-15 DIAGNOSIS — Z1721 Progesterone receptor positive status: Secondary | ICD-10-CM | POA: Diagnosis not present

## 2023-07-15 DIAGNOSIS — D0512 Intraductal carcinoma in situ of left breast: Secondary | ICD-10-CM

## 2023-07-15 MED ORDER — TAMOXIFEN CITRATE 20 MG PO TABS: 20.0000 mg | ORAL_TABLET | Freq: Every day | ORAL | 3 refills | Status: AC

## 2023-07-15 NOTE — Assessment & Plan Note (Signed)
 07/13/2020:Screening mammogram showed left breast calcifications. Diagnostic mammogram showed calcifications in the left breast spanning 2.3cm. Biopsy on 07/07/20 showed high grade DCIS, ER+ 100%, PR+ 70%.  07/26/2020:Left lumpectomy Donell Beers): high grade DCIS partially involving an intraductal papilloma, clear margins.  ER 100%, PR 70% Adjuvant radiation: 08/30/2020 to 09/23/2020   Treatment plan: Adjuvant antiestrogen therapy with tamoxifen 20 mg once daily x5 years started 10/05/2020 Tamoxifen toxicities: 1.  Hot flashes: mild  She takes [place] which contains black cohosh. 2. severe anxiety intermittently: I gave her a prescription for Xanax to be taken as needed.  Especially when she travels she needs the antianxiety medication.   She walks 4 to 6 miles a day.   Breast cancer surveillance: 1.  Breast exam 07/15/2023: Benign 2. mammogram 07/05/2023: Benign breast density category B   Return to clinic in 1 year for follow-up
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
## 2023-09-19 DIAGNOSIS — R58 Hemorrhage, not elsewhere classified: Secondary | ICD-10-CM | POA: Diagnosis not present

## 2023-09-19 DIAGNOSIS — D692 Other nonthrombocytopenic purpura: Secondary | ICD-10-CM | POA: Diagnosis not present

## 2023-10-23 ENCOUNTER — Other Ambulatory Visit: Payer: Self-pay | Admitting: Hematology and Oncology

## 2023-11-13 DIAGNOSIS — L821 Other seborrheic keratosis: Secondary | ICD-10-CM | POA: Diagnosis not present

## 2023-11-13 DIAGNOSIS — D229 Melanocytic nevi, unspecified: Secondary | ICD-10-CM | POA: Diagnosis not present

## 2023-11-13 DIAGNOSIS — L814 Other melanin hyperpigmentation: Secondary | ICD-10-CM | POA: Diagnosis not present

## 2023-11-13 DIAGNOSIS — D1801 Hemangioma of skin and subcutaneous tissue: Secondary | ICD-10-CM | POA: Diagnosis not present

## 2023-11-13 DIAGNOSIS — L578 Other skin changes due to chronic exposure to nonionizing radiation: Secondary | ICD-10-CM | POA: Diagnosis not present

## 2023-11-13 DIAGNOSIS — L570 Actinic keratosis: Secondary | ICD-10-CM | POA: Diagnosis not present

## 2023-12-30 DIAGNOSIS — S81811A Laceration without foreign body, right lower leg, initial encounter: Secondary | ICD-10-CM | POA: Diagnosis not present

## 2024-01-10 DIAGNOSIS — S0083XA Contusion of other part of head, initial encounter: Secondary | ICD-10-CM | POA: Diagnosis not present

## 2024-01-10 DIAGNOSIS — S01112A Laceration without foreign body of left eyelid and periocular area, initial encounter: Secondary | ICD-10-CM | POA: Diagnosis not present

## 2024-01-22 DIAGNOSIS — Z9181 History of falling: Secondary | ICD-10-CM | POA: Diagnosis not present

## 2024-01-22 DIAGNOSIS — I2510 Atherosclerotic heart disease of native coronary artery without angina pectoris: Secondary | ICD-10-CM | POA: Diagnosis not present

## 2024-01-22 DIAGNOSIS — Z1331 Encounter for screening for depression: Secondary | ICD-10-CM | POA: Diagnosis not present

## 2024-01-22 DIAGNOSIS — Z131 Encounter for screening for diabetes mellitus: Secondary | ICD-10-CM | POA: Diagnosis not present

## 2024-01-22 DIAGNOSIS — S0181XD Laceration without foreign body of other part of head, subsequent encounter: Secondary | ICD-10-CM | POA: Diagnosis not present

## 2024-01-22 DIAGNOSIS — Z23 Encounter for immunization: Secondary | ICD-10-CM | POA: Diagnosis not present

## 2024-01-22 DIAGNOSIS — E559 Vitamin D deficiency, unspecified: Secondary | ICD-10-CM | POA: Diagnosis not present

## 2024-01-22 DIAGNOSIS — M8580 Other specified disorders of bone density and structure, unspecified site: Secondary | ICD-10-CM | POA: Diagnosis not present

## 2024-01-22 DIAGNOSIS — Z0000 Encounter for general adult medical examination without abnormal findings: Secondary | ICD-10-CM | POA: Diagnosis not present

## 2024-01-22 DIAGNOSIS — D0512 Intraductal carcinoma in situ of left breast: Secondary | ICD-10-CM | POA: Diagnosis not present

## 2024-01-22 DIAGNOSIS — Z6821 Body mass index (BMI) 21.0-21.9, adult: Secondary | ICD-10-CM | POA: Diagnosis not present

## 2024-04-20 ENCOUNTER — Other Ambulatory Visit: Payer: Self-pay | Admitting: Hematology and Oncology

## 2024-04-24 ENCOUNTER — Other Ambulatory Visit: Payer: Self-pay | Admitting: Hematology and Oncology

## 2024-04-24 DIAGNOSIS — Z9889 Other specified postprocedural states: Secondary | ICD-10-CM

## 2024-07-06 ENCOUNTER — Encounter

## 2024-07-14 ENCOUNTER — Ambulatory Visit: Admitting: Hematology and Oncology
